# Patient Record
Sex: Female | Born: 1955 | Hispanic: No | Marital: Married | Smoking: Never smoker
Health system: Southern US, Community
[De-identification: ages and names within clinical notes are randomized; demographics above are authoritative.]

## PROBLEM LIST (undated history)

## (undated) DIAGNOSIS — M810 Age-related osteoporosis without current pathological fracture: Secondary | ICD-10-CM

## (undated) DIAGNOSIS — I83813 Varicose veins of bilateral lower extremities with pain: Secondary | ICD-10-CM

## (undated) HISTORY — PX: TONSILLECTOMY: SUR1361

## (undated) HISTORY — PX: TUBAL LIGATION: SHX77

## (undated) HISTORY — DX: Varicose veins of bilateral lower extremities with pain: I83.813

## (undated) HISTORY — PX: OTHER SURGICAL HISTORY: SHX169

---

## 2009-09-19 ENCOUNTER — Ambulatory Visit (HOSPITAL_COMMUNITY): Admission: RE | Admit: 2009-09-19 | Discharge: 2009-09-19 | Payer: Self-pay | Admitting: Obstetrics and Gynecology

## 2010-08-14 ENCOUNTER — Ambulatory Visit
Admission: RE | Admit: 2010-08-14 | Discharge: 2010-08-14 | Disposition: A | Discharge status: Home / Self Care | Payer: BC Managed Care – PPO | Source: Ambulatory Visit | Attending: Family Medicine | Admitting: Family Medicine

## 2010-08-14 ENCOUNTER — Other Ambulatory Visit: Payer: Self-pay | Admitting: Family Medicine

## 2010-08-14 DIAGNOSIS — W19XXXA Unspecified fall, initial encounter: Secondary | ICD-10-CM

## 2010-09-14 LAB — CBC
MCV: 67 fL — ABNORMAL LOW (ref 78.0–100.0)
Platelets: 295 10*3/uL (ref 150–400)
WBC: 4.9 10*3/uL (ref 4.0–10.5)

## 2010-09-14 LAB — HCG, SERUM, QUALITATIVE: Preg, Serum: NEGATIVE

## 2012-04-17 ENCOUNTER — Ambulatory Visit
Admission: RE | Admit: 2012-04-17 | Discharge: 2012-04-17 | Disposition: A | Discharge status: Home / Self Care | Payer: BC Managed Care – PPO | Source: Ambulatory Visit | Attending: Family Medicine | Admitting: Family Medicine

## 2012-04-17 ENCOUNTER — Other Ambulatory Visit: Payer: Self-pay | Admitting: Family Medicine

## 2012-04-17 DIAGNOSIS — M5410 Radiculopathy, site unspecified: Secondary | ICD-10-CM

## 2012-04-17 DIAGNOSIS — M545 Low back pain: Secondary | ICD-10-CM

## 2013-06-11 ENCOUNTER — Ambulatory Visit (INDEPENDENT_AMBULATORY_CARE_PROVIDER_SITE_OTHER): Payer: BC Managed Care – PPO | Admitting: Family Medicine

## 2013-06-11 ENCOUNTER — Encounter: Payer: Self-pay | Admitting: Family Medicine

## 2013-06-11 VITALS — BP 130/72 | HR 72 | Temp 97.7°F | Resp 14 | Ht 64.0 in | Wt 139.0 lb

## 2013-06-11 DIAGNOSIS — J029 Acute pharyngitis, unspecified: Secondary | ICD-10-CM

## 2013-06-11 DIAGNOSIS — J111 Influenza due to unidentified influenza virus with other respiratory manifestations: Secondary | ICD-10-CM

## 2013-06-11 MED ORDER — OSELTAMIVIR PHOSPHATE 75 MG PO CAPS: 75.0000 mg | ORAL_CAPSULE | Freq: Two times a day (BID) | ORAL | Status: DC

## 2013-06-11 NOTE — Progress Notes (Signed)
   Subjective:    Patient ID: Rebecca Herman, female    DOB: [birth year], 57 y.o.   MRN: [id number]  HPI  Patient reports 2-3 days of bilateral sinus pressure, sore throat, postnasal drip, cough productive of clear and green mucus, no cervical lymphadenopathy, and diffuse myalgias. She is been exposed to 20 different cases of flu at her school. She denies any exposure to strep throat. Furthermore she declines a strep screen today. Marland KitchenNo past medical history on file. No current outpatient prescriptions on file prior to visit.   No current facility-administered medications on file prior to visit.   No Known Allergies History   Social History  . Marital Status: Married    Spouse Name: N/A    Number of Children: N/A  . Years of Education: N/A   Occupational History  . Not on file.   Social History Main Topics  . Smoking status: Never Smoker   . Smokeless tobacco: Not on file  . Alcohol Use: No  . Drug Use: No  . Sexual Activity: Not on file   Other Topics Concern  . Not on file   Social History Narrative  . No narrative on file     Review of Systems  All other systems reviewed and are negative.       Objective:   Physical Exam  Vitals reviewed. Constitutional: She appears well-developed and well-nourished. No distress.  HENT:  Right Ear: External ear normal.  Left Ear: External ear normal.  Nose: Nose normal.  Mouth/Throat: Oropharynx is clear and moist. No oropharyngeal exudate.  Eyes: Conjunctivae are normal. Pupils are equal, round, and reactive to light. Right eye exhibits no discharge. Left eye exhibits no discharge. No scleral icterus.  Neck: Neck supple.  Cardiovascular: Normal rate, regular rhythm and normal heart sounds.   No murmur heard. Pulmonary/Chest: Effort normal and breath sounds normal. No respiratory distress. She has no wheezes. She has no rales.  Abdominal: Soft. Bowel sounds are normal. She exhibits no distension. There is no tenderness. There is  no rebound.  Lymphadenopathy:    She has cervical adenopathy.  Skin: She is not diaphoretic.          Assessment & Plan:  1. Sore throat  2. Influenza with other respiratory manifestations Patient's exam today does not appear to be strep throat. Furthermore her symptoms a viral upper respiratory infection. Given her numerous exposures to influenza are present influenza. The patient declines an influenza screen today. Begin Tamiflu 75 mg by mouth twice a day for 5 days. - oseltamivir (TAMIFLU) 75 MG capsule; Take 1 capsule (75 mg total) by mouth 2 (two) times daily.  Dispense: 10 capsule; Refill: 0

## 2013-06-18 ENCOUNTER — Telehealth: Payer: Self-pay | Admitting: Family Medicine

## 2013-06-18 ENCOUNTER — Other Ambulatory Visit: Payer: Self-pay | Admitting: Family Medicine

## 2013-06-18 MED ORDER — AMOXICILLIN 875 MG PO TABS: 875.0000 mg | ORAL_TABLET | Freq: Two times a day (BID) | ORAL | Status: DC

## 2013-06-18 NOTE — Telephone Encounter (Signed)
I escribed amoxicillin to [facility] in [place]

## 2013-06-18 NOTE — Telephone Encounter (Signed)
..  Patient aware per vm 

## 2013-06-18 NOTE — Telephone Encounter (Signed)
Pt has an apt tomorrow but she is wanting to know if Dr Tanya Nones would just write her a prescription she thinks it is sinus Call back number is [phone]

## 2013-06-19 ENCOUNTER — Ambulatory Visit (INDEPENDENT_AMBULATORY_CARE_PROVIDER_SITE_OTHER): Payer: BC Managed Care – PPO | Admitting: Family Medicine

## 2013-06-19 ENCOUNTER — Encounter: Payer: Self-pay | Admitting: Family Medicine

## 2013-06-19 VITALS — BP 110/68 | HR 74 | Temp 97.1°F | Resp 14 | Ht 63.5 in | Wt 143.0 lb

## 2013-06-19 DIAGNOSIS — J019 Acute sinusitis, unspecified: Secondary | ICD-10-CM

## 2013-06-19 NOTE — Progress Notes (Signed)
Subjective:    Patient ID: Rebecca Herman, female    DOB: 06/03/1956, 57 y.o.   MRN: [id number]  HPI  06/11/13  Patient reports 2-3 days of bilateral sinus pressure, sore throat, postnasal drip, cough productive of clear and green mucus, no cervical lymphadenopathy, and diffuse myalgias. She is been exposed to 20 different cases of flu at her school. She denies any exposure to strep throat. Furthermore she declines a strep screen today.  At that time, my plan was: 1. Sore throat 2. Influenza with other respiratory manifestations Patient's exam today does not appear to be strep throat. Furthermore her symptoms are consistent with a viral upper respiratory infection. Given her numerous exposures to influenza, I believe her present infection is influenza. The patient declines an influenza screen today. Begin Tamiflu 75 mg by mouth twice a day for 5 days. - oseltamivir (TAMIFLU) 75 MG capsule; Take 1 capsule (75 mg total) by mouth 2 (two) times daily.  Dispense: 10 capsule; Refill: 0  06/19/13 Patient has now had her symptoms for 10 days. She continues to have a low-grade fever. Furthermore she developed pain in her maxillary and frontal sinuses. She has a pounding throbbing headache primarily in her occiput. She has brown purulent nasal discharge epistaxis.  This has began last 2 days. She denies any shortness of breath. Cough is gradually improving. Marland KitchenNo past medical history on file. Current Outpatient Prescriptions on File Prior to Visit  Medication Sig Dispense Refill  . amoxicillin (AMOXIL) 875 MG tablet Take 1 tablet (875 mg total) by mouth 2 (two) times daily.  20 tablet  0   No current facility-administered medications on file prior to visit.   No Known Allergies History   Social History  . Marital Status: Married    Spouse Name: N/A    Number of Children: N/A  . Years of Education: N/A   Occupational History  . Not on file.   Social History Main Topics  . Smoking status: Never  Smoker   . Smokeless tobacco: Not on file  . Alcohol Use: No  . Drug Use: No  . Sexual Activity: Not on file   Other Topics Concern  . Not on file   Social History Narrative  . No narrative on file     Review of Systems  All other systems reviewed and are negative.       Objective:   Physical Exam  Vitals reviewed. Constitutional: She appears well-developed and well-nourished. No distress.  HENT:  Right Ear: External ear normal.  Left Ear: External ear normal.  Nose: Mucosal edema and rhinorrhea present. Right sinus exhibits maxillary sinus tenderness. Left sinus exhibits maxillary sinus tenderness.  Mouth/Throat: Oropharynx is clear and moist. No oropharyngeal exudate.  Eyes: Conjunctivae are normal. Pupils are equal, round, and reactive to light. Right eye exhibits no discharge. Left eye exhibits no discharge. No scleral icterus.  Neck: Neck supple.  Cardiovascular: Normal rate, regular rhythm and normal heart sounds.   No murmur heard. Pulmonary/Chest: Effort normal and breath sounds normal. No respiratory distress. She has no wheezes. She has no rales.  Abdominal: Soft. Bowel sounds are normal. She exhibits no distension. There is no tenderness. There is no rebound.  Lymphadenopathy:    She has cervical adenopathy.  Skin: She is not diaphoretic.          Assessment & Plan:   1. Acute rhinosinusitis I explained to the patient the typical course for viral influenza is  7-10 days. She is  approaching the end of this convalescent period.  However I do believe she may have developed a secondary bacterial sinusitis. I will begin the patient on amoxicillin 875 mg by mouth twice a day for 10 days. I continue to recommend supportive care including Sudafed for congestion, Mucinex for cough and congestion, and ibuprofen for aches and pains and fever.

## 2013-08-20 ENCOUNTER — Telehealth: Payer: Self-pay | Admitting: Family Medicine

## 2013-08-20 NOTE — Telephone Encounter (Signed)
Ok to work in.

## 2013-08-20 NOTE — Telephone Encounter (Signed)
Called pt and she is stating that she is hurting really bad and wants to come in and see you tomorrow if at all possible. IS it ok with you to put her in sameday appt at 10am tomorrow?

## 2013-08-20 NOTE — Telephone Encounter (Signed)
Pt aware.

## 2013-08-20 NOTE — Telephone Encounter (Signed)
Call back number is during the day till 4 [phone] and after 4 call [phone] The veins left leg on the calve are swollen and they hurt and it has a knot she is wanting a referral to the vascular doctor

## 2013-08-21 ENCOUNTER — Ambulatory Visit (INDEPENDENT_AMBULATORY_CARE_PROVIDER_SITE_OTHER): Payer: BC Managed Care – PPO | Admitting: Family Medicine

## 2013-08-21 ENCOUNTER — Encounter: Payer: Self-pay | Admitting: Family Medicine

## 2013-08-21 VITALS — BP 134/86 | HR 64 | Temp 97.8°F | Resp 18 | Ht 61.75 in | Wt 145.0 lb

## 2013-08-21 DIAGNOSIS — I809 Phlebitis and thrombophlebitis of unspecified site: Secondary | ICD-10-CM

## 2013-08-21 NOTE — Progress Notes (Signed)
   Subjective:    Patient ID: Rebecca Herman, female    DOB: June 13, 1956, 58 y.o.   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 Yesterday, a varicosity on her posterior left calf became red hot and tender. Today it is much better. There is no swelling in the left leg compared to the right leg. The circumference of her left leg is 39 cm. The circumference of her right leg is 39 cm. Both measurements are taken 10 cm below the inferior pole of the patella. There is no redness. There is a tender varicosity in her posterior left calf but otherwise the examination of her left leg is normal. She has no pitting edema in the legs. No past medical history on file. No current outpatient prescriptions on file prior to visit.   No current facility-administered medications on file prior to visit.   No Known Allergies History   Social History  . Marital Status: Married    Spouse Name: N/A    Number of Children: N/A  . Years of Education: N/A   Occupational History  . Not on file.   Social History Main Topics  . Smoking status: Never Smoker   . Smokeless tobacco: Not on file  . Alcohol Use: No  . Drug Use: No  . Sexual Activity: Not on file   Other Topics Concern  . Not on file   Social History Narrative  . No narrative on file      Review of Systems  All other systems reviewed and are negative.       Objective:   Physical Exam  Vitals reviewed. Cardiovascular: Normal rate, regular rhythm and normal heart sounds.   Pulmonary/Chest: Effort normal and breath sounds normal. No respiratory distress. She has no wheezes. She has no rales.  Musculoskeletal: She exhibits no edema.  Skin: No rash noted. No erythema.  tender varicose vein on the posterior left calf        Assessment & Plan:  1. Superficial thrombophlebitis Begin warm compresses 4 times a day, began taking aspirin as needed for pain and  swelling. But the leg is much as possible. Recheck if symptoms worsen. Anticipate spontaneous resolution over the next week. I do not feel the patient has a DVT.

## 2014-01-25 ENCOUNTER — Other Ambulatory Visit: Payer: Self-pay | Admitting: Obstetrics & Gynecology

## 2014-01-28 LAB — CYTOLOGY - PAP

## 2014-09-11 ENCOUNTER — Encounter: Payer: Self-pay | Admitting: Family Medicine

## 2014-09-11 ENCOUNTER — Encounter: Payer: Self-pay | Admitting: Physician Assistant

## 2014-09-11 ENCOUNTER — Ambulatory Visit (INDEPENDENT_AMBULATORY_CARE_PROVIDER_SITE_OTHER): Payer: BC Managed Care – PPO | Admitting: Physician Assistant

## 2014-09-11 VITALS — BP 144/86 | HR 64 | Temp 98.1°F | Resp 18 | Wt 158.0 lb

## 2014-09-11 DIAGNOSIS — J0120 Acute ethmoidal sinusitis, unspecified: Secondary | ICD-10-CM | POA: Diagnosis not present

## 2014-09-11 DIAGNOSIS — J988 Other specified respiratory disorders: Secondary | ICD-10-CM

## 2014-09-11 DIAGNOSIS — B9689 Other specified bacterial agents as the cause of diseases classified elsewhere: Secondary | ICD-10-CM

## 2014-09-11 MED ORDER — PREDNISONE 20 MG PO TABS: ORAL_TABLET | ORAL | Status: DC

## 2014-09-11 MED ORDER — FLUCONAZOLE 150 MG PO TABS: 150.0000 mg | ORAL_TABLET | Freq: Once | ORAL | Status: DC

## 2014-09-11 MED ORDER — AZITHROMYCIN 250 MG PO TABS: ORAL_TABLET | ORAL | Status: DC

## 2014-09-11 NOTE — Progress Notes (Signed)
Patient ID: Juanice Warburton MRN: [id number], DOB: 12-22-1955, 59 y.o. Date of Encounter: @DATE @  Chief Complaint:  Chief Complaint  Patient presents with  . sick x 2 days    sinus infection    HPI: 59 y.o. year old female  presents with above symptoms. Says she's actually been sick for over a week but the symptoms of just gotten a lot worse over the past 2 days. Is that she has been blowing thick yellow-green mucus from her nose and also coughing up phlegm that is the same thick yellow-green. Says the past couple of days it is really gotten into her sinuses to the point it is making her top teeth hurt and her years have a ringing sound in them. Also has spells of coughing "fits ". Says that she is not getting good sleep because she ends up having to get up and walking around to get things settled down and stop coughing and settle the congestion. Works at [employer] and is constantly around children with germs.   History reviewed. No pertinent past medical history.   Home Meds: No outpatient prescriptions prior to visit.   No facility-administered medications prior to visit.    Allergies: No Known Allergies  History   Social History  . Marital Status: Married    Spouse Name: N/A  . Number of Children: N/A  . Years of Education: N/A   Occupational History  . Not on file.   Social History Main Topics  . Smoking status: Never Smoker   . Smokeless tobacco: Not on file  . Alcohol Use: No  . Drug Use: No  . Sexual Activity: Not on file   Other Topics Concern  . Not on file   Social History Narrative    History reviewed. No pertinent family history.   Review of Systems:  See HPI for pertinent ROS. All other ROS negative.    Physical Exam: Blood pressure 144/86, pulse 64, temperature 98.1 F (36.7 C), temperature source Oral, resp. rate 18, weight 158 lb (71.668 kg)., Body mass index is 29.15 kg/(m^2). General: Female. Sounds congested when she talks. Appears in no  acute distress. Head: Normocephalic, atraumatic, eyes without discharge, sclera non-icteric, nares are without discharge. Bilateral auditory canals clear, TM's are without perforation, pearly grey and translucent with reflective cone of light bilaterally. Oral cavity moist, posterior pharynx without exudate, erythema, peritonsillar abscess. Mild tenderness with percussion of bilateral maxillary sinuses. Minimal tenderness with percussion of frontal sinuses.  Neck: Supple. No thyromegaly. No lymphadenopathy. Lungs: Clear bilaterally to auscultation without wheezes, rales, or rhonchi. Breathing is unlabored. Heart: RRR with S1 S2. No murmurs, rubs, or gallops. Musculoskeletal:  Strength and tone normal for age. Extremities/Skin: Warm and dry.  Neuro: Alert and oriented X 3. Moves all extremities spontaneously. Gait is normal. CNII-XII grossly in tact. Psych:  Responds to questions appropriately with a normal affect.     ASSESSMENT AND PLAN:  59 y.o. year old female with  1. Acute ethmoidal sinusitis, recurrence not specified - azithromycin (ZITHROMAX) 250 MG tablet; Day 1: Take 2 daily. Days 2-5: Take 1 daily.  Dispense: 6 tablet; Refill: 0 - predniSONE (DELTASONE) 20 MG tablet; Take 3 daily for 2 days, then 2 daily for 2 days, then 1 daily for 2 days.  Dispense: 12 tablet; Refill: 0  2. Bacterial respiratory infection - azithromycin (ZITHROMAX) 250 MG tablet; Day 1: Take 2 daily. Days 2-5: Take 1 daily.  Dispense: 6 tablet; Refill: 0 - predniSONE (DELTASONE)  20 MG tablet; Take 3 daily for 2 days, then 2 daily for 2 days, then 1 daily for 2 days.  Dispense: 12 tablet; Refill: 0   Take prednisone and antibiotic as directed and complete both of them as directed. Can use over-the-counter decongestants and cough medications as needed for symptom relief as well. Follow-up if symptoms do not resolve with completion of antibiotic and prednisone. She also states that antibiotic frequently gives her  yeast infection so I did send in one Diflucan after I had printed the above.  [address] [place], [place], [facility] 09/11/2014 10:25 AM

## 2015-01-30 ENCOUNTER — Other Ambulatory Visit: Payer: Self-pay | Admitting: Obstetrics and Gynecology

## 2015-01-31 LAB — CYTOLOGY - PAP

## 2015-02-05 ENCOUNTER — Encounter: Payer: Self-pay | Admitting: Physician Assistant

## 2015-02-05 ENCOUNTER — Ambulatory Visit (INDEPENDENT_AMBULATORY_CARE_PROVIDER_SITE_OTHER): Payer: BC Managed Care – PPO | Admitting: Physician Assistant

## 2015-02-05 VITALS — BP 126/76 | HR 72 | Temp 98.1°F | Resp 16 | Ht 62.0 in | Wt 155.0 lb

## 2015-02-05 DIAGNOSIS — B029 Zoster without complications: Secondary | ICD-10-CM

## 2015-02-05 MED ORDER — VALACYCLOVIR HCL 1 G PO TABS: 1000.0000 mg | ORAL_TABLET | Freq: Three times a day (TID) | ORAL | Status: DC

## 2015-02-05 MED ORDER — GABAPENTIN 300 MG PO CAPS: ORAL_CAPSULE | ORAL | Status: DC

## 2015-02-05 NOTE — Progress Notes (Addendum)
Patient ID: Rebecca Herman MRN: [id number], DOB: 09/14/1955, 59 y.o. Date of Encounter: 02/05/2015, 11:44 AM    Chief Complaint:  Chief Complaint  Patient presents with  . Acute Visit    possible shingles on back.     HPI: 59 y.o. year old female states that 1-1/2 weeks ago she developed a rash--which she shows me today--currently is an approximate 1 inch diameter circle area that is area of resolving vesicles clustered/bunched within this 1 inch diameter circle. She states that there has been no other area of rash. She states that at the same time she noticed this rash, she also noticed sciatic-type pain going down the lateral aspect of her right leg. Has had no other area of rash. Has had no other area of pain. No other complaints or concerns today.     Home Meds:   Outpatient Prescriptions Prior to Visit  Medication Sig Dispense Refill  . Calcium Carbonate-Vitamin D (CALCIUM + D PO) Take 1 tablet by mouth 2 (two) times daily.    . Multiple Vitamin (MULTIVITAMIN) tablet Take 1 tablet by mouth daily.    Marland Kitchen azithromycin (ZITHROMAX) 250 MG tablet Day 1: Take 2 daily. Days 2-5: Take 1 daily. (Patient not taking: Reported on 02/05/2015) 6 tablet 0  . fluconazole (DIFLUCAN) 150 MG tablet Take 1 tablet (150 mg total) by mouth once. (Patient not taking: Reported on 02/05/2015) 1 tablet 0  . predniSONE (DELTASONE) 20 MG tablet Take 3 daily for 2 days, then 2 daily for 2 days, then 1 daily for 2 days. (Patient not taking: Reported on 02/05/2015) 12 tablet 0   No facility-administered medications prior to visit.    Allergies: No Known Allergies    Review of Systems: See HPI for pertinent ROS. All other ROS negative.    Physical Exam: Blood pressure 126/76, pulse 72, temperature 98.1 F (36.7 C), temperature source Oral, resp. rate 16, height 5\' 2"  (1.575 m), weight 155 lb (70.308 kg)., Body mass index is 28.34 kg/(m^2). General:  WNWD Female. Appears in no acute distress. Neck: Supple. No  thyromegaly. No lymphadenopathy. Lungs: Clear bilaterally to auscultation without wheezes, rales, or rhonchi. Breathing is unlabored. Heart: Regular rhythm. No murmurs, rubs, or gallops. Msk:  Strength and tone normal for age. Extremities/Skin: Just above the top of the crease of her buttocks, on her low back, at approx L5-S1 level, midline, there is a 1-inch diameter circle that is filled with drying/scabbing vessicles.  Neuro: Alert and oriented X 3. Moves all extremities spontaneously. Gait is normal. CNII-XII grossly in tact. Psych:  Responds to questions appropriately with a normal affect.     ASSESSMENT AND PLAN:  59 y.o. year old female with  1. Shingles - valACYclovir (VALTREX) 1000 MG tablet; Take 1 tablet (1,000 mg total) by mouth 3 (three) times daily.  Dispense: 21 tablet; Refill: 0 - gabapentin (NEURONTIN) 300 MG capsule; Day 1: Take one. Day 2: Take one twice a day. Day 3: Take one 3 times a day.  Continue this dose. Call if pain not controlled with this dose.  Dispense: 90 capsule; Refill: 1  I discussed with her that with shingles some people have very minimal pain, some people have very severe pain. Also discussed that the duration of the pain varies from person to person. Told her to take the Neurontin as directed above. Told her once she gets to be on day 3 and has been taking one 3 times daily for a few days, that if  this dose is not controlling her pain-- to call us for further instructions for further dosing adjustment. Told her that after about 2 weeks of pain being controlled, she can try decreasing the Neurontin and see if she can wean off of it. Discussed that duration of pain and need for this medication varies.  She is currently having mild discomfort so I feel that the Neurontin should control her pain symptoms without needing "pain pills ".  Discussed need to avoid being in close proximity to pregnant women. And avoid skin to skin contact in this  region.  Signed, [address] [place], [place], [facility] 02/05/2015 11:44 AM

## 2015-02-13 ENCOUNTER — Other Ambulatory Visit: Payer: Self-pay | Admitting: Obstetrics and Gynecology

## 2015-02-13 DIAGNOSIS — Z803 Family history of malignant neoplasm of breast: Secondary | ICD-10-CM

## 2015-09-15 ENCOUNTER — Encounter: Payer: Self-pay | Admitting: Family Medicine

## 2015-09-15 ENCOUNTER — Ambulatory Visit (INDEPENDENT_AMBULATORY_CARE_PROVIDER_SITE_OTHER): Payer: BC Managed Care – PPO | Admitting: Family Medicine

## 2015-09-15 VITALS — BP 130/72 | HR 74 | Temp 97.9°F | Resp 18 | Ht 62.0 in | Wt 164.0 lb

## 2015-09-15 DIAGNOSIS — K219 Gastro-esophageal reflux disease without esophagitis: Secondary | ICD-10-CM

## 2015-09-15 MED ORDER — DEXLANSOPRAZOLE 60 MG PO CPDR: 60.0000 mg | DELAYED_RELEASE_CAPSULE | Freq: Every day | ORAL | Status: DC

## 2015-09-15 NOTE — Patient Instructions (Signed)
Try the dexilant once a day  Work on dietary changes Call if not improved in 2 weeks and referral to Dr. Earlean Shawl F/U as needed

## 2015-09-15 NOTE — Progress Notes (Signed)
Patient ID: Rebecca Herman, female   DOB: 01-18-1956, 60 y.o.   MRN: [id number]   Subjective:    Patient ID: Rebecca Herman, female    DOB: 02/24/1956, 60 y.o.   MRN: [id number]  Patient presents for Swallowing Difficulty Patient here after an episode of difficulty swallowing. She states that she was eating a cinnamon bun late at night and she felt like he got lost in her throat she had some coughing afterwards. She however has noticed some mild reflux symptoms. She's had reflux in the past after she changed her diet she went off her medications. She even had an EGD done in the past but she does not recall any strictures. She notices a sour brash-like taste in the back her mouth and also change to her breath over the past few months. She does admit that she has been eating more fried foods and heavy meal she is admitted about 10 pounds since last summer. She does not feel any acid sensation coming up the chest denies any change in her bowels denies any nausea vomiting. She's not had any other symptoms of dysphagia    Review Of Systems:  GEN- denies fatigue, fever, weight loss,weakness, recent illness HEENT- denies eye drainage, change in vision, nasal discharge, CVS- denies chest pain, palpitations RESP- denies SOB, cough, wheeze ABD- denies N/V, change in stools, abd pain GU- denies dysuria, hematuria, dribbling, incontinence MSK- denies joint pain, muscle aches, injury Neuro- denies headache, dizziness, syncope, seizure activity       Objective:    BP 130/72 mmHg  Pulse 74  Temp(Src) 97.9 F (36.6 C) (Oral)  Resp 18  Ht 5\' 2"  (1.575 m)  Wt 164 lb (74.39 kg)  BMI 29.99 kg/m2 GEN- NAD, alert and oriented x3 HEENT- PERRL, EOMI, non injected sclera, pink conjunctiva, MMM, oropharynx clear Neck- Supple, no LAD  CVS- RRR, no murmur RESP-CTAB ABD-NABS,soft,NT,ND         Assessment & Plan:      Problem List Items Addressed This Visit    None    Visit Diagnoses    Gastroesophageal reflux disease without esophagitis    -  Primary    Recent globus sensation with food getting stuck, able to take vitamins eat otherwise without any other symptoms, could be recurrnce of GERD, with other symptoms, trial of dexilant, [employer] given. Also discussed dietary changes. She has recurrence of the sensation of food getting stuck and recommended EGD be done     Relevant Medications    dexlansoprazole (DEXILANT) 60 MG capsule       Note: This dictation was prepared with Dragon dictation along with smaller phrase technology. Any transcriptional errors that result from this process are unintentional.

## 2015-10-08 ENCOUNTER — Ambulatory Visit: Payer: BC Managed Care – PPO | Admitting: Podiatry

## 2015-10-09 ENCOUNTER — Ambulatory Visit (INDEPENDENT_AMBULATORY_CARE_PROVIDER_SITE_OTHER): Payer: BC Managed Care – PPO | Admitting: Podiatry

## 2015-10-09 ENCOUNTER — Ambulatory Visit: Payer: Self-pay

## 2015-10-09 ENCOUNTER — Ambulatory Visit (INDEPENDENT_AMBULATORY_CARE_PROVIDER_SITE_OTHER): Payer: BC Managed Care – PPO

## 2015-10-09 ENCOUNTER — Encounter: Payer: Self-pay | Admitting: Podiatry

## 2015-10-09 VITALS — BP 130/72 | HR 67 | Resp 16 | Ht 63.5 in | Wt 160.0 lb

## 2015-10-09 DIAGNOSIS — M79671 Pain in right foot: Secondary | ICD-10-CM | POA: Diagnosis not present

## 2015-10-09 DIAGNOSIS — M722 Plantar fascial fibromatosis: Secondary | ICD-10-CM

## 2015-10-09 DIAGNOSIS — M79673 Pain in unspecified foot: Secondary | ICD-10-CM

## 2015-10-09 DIAGNOSIS — M79672 Pain in left foot: Secondary | ICD-10-CM

## 2015-10-09 MED ORDER — TRIAMCINOLONE ACETONIDE 10 MG/ML IJ SUSP
10.0000 mg | Freq: Once | INTRAMUSCULAR | Status: AC
  Administered 2015-10-09: 10 mg

## 2015-10-09 NOTE — Progress Notes (Signed)
   Subjective:    Patient ID: Rebecca Herman, female    DOB: 12-11-1955, 60 y.o.   MRN: [id number]  HPI Chief Complaint  Patient presents with  . Foot Pain    Bilateral; heel; pt stated, "Has more pain in Left foot; went to [place] last week; did not get an injection and does not prefer to get one"      Review of Systems  All other systems reviewed and are negative.      Objective:   Physical Exam        Assessment & Plan:

## 2015-10-09 NOTE — Progress Notes (Signed)
Subjective:     Patient ID: Rebecca Herman, female   DOB: 09-28-1955, 60 y.o.   MRN: [id number]  HPI patient presents stating I have a lot of pain in the bottom of my left heel and I went to another doctor who gave me a brace but it's not been helpful and I cannot do any the exercises that I enjoyed doing   Review of Systems  All other systems reviewed and are negative.      Objective:   Physical Exam  Constitutional: She is oriented to person, place, and time.  Cardiovascular: Intact distal pulses.   Musculoskeletal: Normal range of motion.  Neurological: She is oriented to person, place, and time.  Skin: Skin is warm.  Nursing note and vitals reviewed.  neurovascular status intact muscle strength adequate range of motion within normal limits with patient found to have quite a bit of discomfort in the plantar aspect left heel at the insertional point tendon the calcaneus with inflammation and fluid around the medial band. Patient's noted to have moderate depression of the arch also noted and has good digital perfusion and is well oriented 3     Assessment:     Inflammatory fasciitis left heel at the insertion of tendon into the calcaneus    Plan:     H&P and x-ray reviewed with patient. I injected the left plantar fashion 3 mg Kenalog 5 mg Xylocaine and applied fascial brace with instructions on usage. Placed into supportive shoes and instructed on physical therapy and reappoint in 2 weeks  X-ray report was negative for signs of fracture and indicated minimal spur formation

## 2015-10-09 NOTE — Patient Instructions (Signed)

## 2015-10-23 ENCOUNTER — Ambulatory Visit: Payer: BC Managed Care – PPO | Admitting: Podiatry

## 2015-10-27 ENCOUNTER — Ambulatory Visit (INDEPENDENT_AMBULATORY_CARE_PROVIDER_SITE_OTHER): Payer: BC Managed Care – PPO | Admitting: Podiatry

## 2015-10-27 ENCOUNTER — Encounter: Payer: Self-pay | Admitting: Podiatry

## 2015-10-27 DIAGNOSIS — M722 Plantar fascial fibromatosis: Secondary | ICD-10-CM

## 2015-10-27 MED ORDER — TRIAMCINOLONE ACETONIDE 10 MG/ML IJ SUSP
10.0000 mg | Freq: Once | INTRAMUSCULAR | Status: AC
  Administered 2015-10-27: 10 mg

## 2015-10-29 NOTE — Progress Notes (Signed)
Subjective:     Patient ID: Rebecca Herman, female   DOB: 06/24/1955, 60 y.o.   MRN: [id number]  HPI patient states I'm doing pretty well but I'm still having a spot of pain in my heel   Review of Systems     Objective:   Physical Exam Neurovascular status intact muscle strength adequate with patient still having discomfort plantar heel left at the insertional point tendon into the calcaneus    Assessment:     Plantar fasciitis improving but still present    Plan:     Reinjected the plantar fascial left 3 [place] Kenalog 5 mg Xylocaine advised on physical therapy and reappoint to recheck

## 2016-08-27 ENCOUNTER — Encounter: Payer: Self-pay | Admitting: Family Medicine

## 2016-08-27 ENCOUNTER — Ambulatory Visit (INDEPENDENT_AMBULATORY_CARE_PROVIDER_SITE_OTHER): Payer: BC Managed Care – PPO | Admitting: Family Medicine

## 2016-08-27 VITALS — BP 126/68 | HR 74 | Temp 98.0°F | Resp 16 | Ht 63.5 in | Wt 144.0 lb

## 2016-08-27 DIAGNOSIS — I872 Venous insufficiency (chronic) (peripheral): Secondary | ICD-10-CM | POA: Diagnosis not present

## 2016-08-27 NOTE — Progress Notes (Signed)
   Subjective:    Patient ID: Rebecca Herman, female    DOB: 01-22-1956, 61 y.o.   MRN: [id number]  HPI  The patient reports swelling on the left side of her body. She states that she feels that swollen on the left side of her face in her gluteus and in her left leg and left arm. However on close inspection today I can see no obvious asymmetry. In fact her calf circumference in her right leg is 41.5 cm 3 inches below the patella. It is 40 cm in the left leg 3 inches below the patella and is actually less. There is no pitting edema. There is no edema in her arms. There is no visible edema in her face or in her lips. Her heart shows normal sinus rhythm with no murmurs or rubs. Her lungs are clear to auscultation and there is no JVD. She does have very large varicose veins particularly on the left leg in the hamstring area. Some are approximately 2-3 cm in diameter. This can cause possible episodic swelling in her legs No past medical history on file. No past surgical history on file. Current Outpatient Prescriptions on File Prior to Visit  Medication Sig Dispense Refill  . Calcium Carbonate-Vitamin D (CALCIUM + D PO) Take 1 tablet by mouth 2 (two) times daily.    Marland Kitchen CINNAMON PO Take 1,000 mg by mouth daily.    . Multiple Vitamin (MULTIVITAMIN) tablet Take 1 tablet by mouth daily.     No current facility-administered medications on file prior to visit.    No Known Allergies Social History   Social History  . Marital status: Married    Spouse name: N/A  . Number of children: N/A  . Years of education: N/A   Occupational History  . Not on file.   Social History Main Topics  . Smoking status: Never Smoker  . Smokeless tobacco: Never Used  . Alcohol use No  . Drug use: No  . Sexual activity: Not on file   Other Topics Concern  . Not on file   Social History Narrative  . No narrative on file     Review of Systems  All other systems reviewed and are negative.      Objective:   Physical Exam  Neck: No JVD present.  Cardiovascular: Normal rate, regular rhythm and normal heart sounds.   No murmur heard. Pulmonary/Chest: Effort normal and breath sounds normal. No respiratory distress. She has no wheezes. She has no rales.  Abdominal: Soft. Bowel sounds are normal. She exhibits no distension. There is no tenderness. There is no rebound and no guarding.  Musculoskeletal: She exhibits no edema.  Vitals reviewed.         Assessment & Plan:  Chronic venous insufficiency  I can appreciate no asymmetry in the body. There is no obvious swelling. There is severe varicose veins secondary to chronic venous insufficiency. I recommended that the patient wear thigh-high compression stockings. I reassured her that there is no risk of blood clot that I can appreciate on today's exam. I will be glad to refer her to a vascular surgeon to discuss surgical options for treatment, but the patient is undecided at the present time

## 2016-08-30 ENCOUNTER — Telehealth: Payer: Self-pay | Admitting: Family Medicine

## 2016-08-30 DIAGNOSIS — I872 Venous insufficiency (chronic) (peripheral): Secondary | ICD-10-CM

## 2016-08-30 NOTE — Telephone Encounter (Signed)
Patient would like a referral for her Varicose  vein she states she has discussed with Dr. Dennard Schaumann at her last appt.  CB# [phone]

## 2016-09-02 NOTE — Telephone Encounter (Signed)
ok 

## 2016-09-02 NOTE — Telephone Encounter (Signed)
Referral placed to Vein and Vascular

## 2016-09-23 ENCOUNTER — Encounter: Payer: Self-pay | Admitting: Vascular Surgery

## 2016-10-04 ENCOUNTER — Encounter: Payer: Self-pay | Admitting: Vascular Surgery

## 2016-10-04 ENCOUNTER — Ambulatory Visit (INDEPENDENT_AMBULATORY_CARE_PROVIDER_SITE_OTHER): Payer: BC Managed Care – PPO | Admitting: Vascular Surgery

## 2016-10-04 VITALS — BP 128/87 | HR 61 | Temp 98.5°F | Resp 16 | Ht 63.0 in | Wt 144.0 lb

## 2016-10-04 DIAGNOSIS — I83891 Varicose veins of right lower extremities with other complications: Secondary | ICD-10-CM | POA: Diagnosis not present

## 2016-10-04 DIAGNOSIS — I83893 Varicose veins of bilateral lower extremities with other complications: Secondary | ICD-10-CM | POA: Insufficient documentation

## 2016-10-04 NOTE — Progress Notes (Signed)
Subjective:     Patient ID: Rebecca Herman, female   DOB: 1955/06/26, 61 y.o.   MRN: [id number]  HPI This 61 year old female was referred by [facility] for evaluation of painful varicose veins.The patient previously had laser ablation of the right great saphenous vein performed in [facility] in 2006. She did not have stab phlebectomy. She did have residual bulging varicosities but they were improved from prior to the procedure. She now experiences left leg discomfort frequently at night which awakens her and is in aching quality. She denies a history of DVT thrombophlebitis stasis ulcers or bleeding. She does not elastic compression stockings. Both legs ache during her work week.  Past Medical History:  Diagnosis Date  . Varicose veins of bilateral lower extremities with pain     Social History  Substance Use Topics  . Smoking status: Never Smoker  . Smokeless tobacco: Never Used  . Alcohol use No    Family History  Problem Relation Age of Onset  . Cancer Mother     breast  . Diabetes Mother   . Hypertension Mother   . Cancer Father     No Known Allergies   Current Outpatient Prescriptions:  .  Calcium Carbonate-Vitamin D (CALCIUM + D PO), Take 1 tablet by mouth 2 (two) times daily., Disp: , Rfl:  .  CINNAMON PO, Take 1,000 mg by mouth daily., Disp: , Rfl:  .  Multiple Vitamin (MULTIVITAMIN) tablet, Take 1 tablet by mouth daily., Disp: , Rfl:  .  TURMERIC PO, Take by mouth., Disp: , Rfl:   Vitals:   10/04/16 1355 10/04/16 1358  BP: (!) 146/81 128/87  Pulse: 61 61  Resp: 16   Temp: 98.5 F (36.9 C)   SpO2: 100%   Weight: 144 lb (65.3 kg)   Height: 5\' 3"  (1.6 m)     Body mass index is 25.51 kg/m.        Review of Systems Denies chest pain, dyspnea on exertion, PND, orthopnea, hemoptysis, claudication    Objective:   Physical Exam BP 128/87 (BP Location: Left Arm, Patient Position: Sitting, Cuff Size: Normal)   Pulse 61   Temp 98.5  F (36.9 C)   Resp 16   Ht 5\' 3"  (1.6 m)   Wt 144 lb (65.3 kg)   SpO2 100%   BMI 25.51 kg/m     Gen.-alert and oriented x3 in no apparent distress HEENT normal for age Lungs no rhonchi or wheezing Cardiovascular regular rhythm no murmurs carotid pulses 3+ palpable no bruits audible Abdomen soft nontender no palpable masses Musculoskeletal free of  major deformities Skin clear -no rashes Neurologic normal Lower extremities 3+ femoral and dorsalis pedis pulses palpable bilaterally with no edema Right leg with extensive bulging varicosities beginning in the mid anterior thigh extending lateral to the knee and into the left calf also extending into the posterior calf and posterior distal thigh. No hyperpigmentation or ulceration noted. Left leg has bulging varicosities primarily posteriorly in the calf and popliteal fossa area with no hyperpigmentation or ulceration noted.  Today I performed a bedside SonoSite ultrasound exam. The left great saphenous vein appears normal in size with no reflux. Was unable to visualize the left small saphenous vein On the right leg the great saphenous vein appears to be absent consistent with previous ablation Right small saphenous vein was not visualized       Assessment:     Bilateral painful varicosities right worse than left with history  of previous laser ablation right great saphenous vein in 2006 in [facility] Current symptoms include bilateral leg pain which are affecting patient's daily living    Plan:         #1 long leg elastic compression stockings 20-30 mm gradient #2 elevate legs as much as possible #3 ibuprofen daily on a regular basis for pain #4 return in 3 months-she will have formal reflux bilateral exam performed upon return in formal recommendation will be made at that time. She may be a candidate only for bilateral stab phlebectomy but uncertain until we see the results of formal venous reflux exam

## 2016-10-05 NOTE — Addendum Note (Signed)
Addended by: Lianne Cure A on: 10/05/2016 09:10 AM   Modules accepted: Orders

## 2016-12-30 ENCOUNTER — Ambulatory Visit (INDEPENDENT_AMBULATORY_CARE_PROVIDER_SITE_OTHER): Payer: BC Managed Care – PPO | Admitting: Family Medicine

## 2016-12-30 ENCOUNTER — Encounter: Payer: Self-pay | Admitting: Family Medicine

## 2016-12-30 VITALS — BP 140/98 | HR 72 | Temp 98.7°F | Resp 14 | Ht 63.5 in | Wt 147.0 lb

## 2016-12-30 DIAGNOSIS — R238 Other skin changes: Secondary | ICD-10-CM

## 2016-12-30 DIAGNOSIS — R233 Spontaneous ecchymoses: Secondary | ICD-10-CM

## 2016-12-30 LAB — COMPLETE METABOLIC PANEL WITH GFR
ALBUMIN: 4.1 g/dL (ref 3.6–5.1)
ALK PHOS: 93 U/L (ref 33–130)
ALT: 16 U/L (ref 6–29)
AST: 18 U/L (ref 10–35)
BILIRUBIN TOTAL: 0.5 mg/dL (ref 0.2–1.2)
BUN: 22 mg/dL (ref 7–25)
CALCIUM: 9.3 mg/dL (ref 8.6–10.4)
CO2: 27 mmol/L (ref 20–31)
Chloride: 101 mmol/L (ref 98–110)
Creat: 0.9 mg/dL (ref 0.50–1.05)
GFR, EST AFRICAN AMERICAN: 82 mL/min (ref >=60)
GFR, EST NON AFRICAN AMERICAN: 71 mL/min (ref >=60)
GLUCOSE: 88 mg/dL (ref 70–99)
Potassium: 3.8 mmol/L (ref 3.5–5.3)
Sodium: 139 mmol/L (ref 135–146)
TOTAL PROTEIN: 6.6 g/dL (ref 6.1–8.1)

## 2016-12-30 LAB — CBC WITH DIFFERENTIAL/PLATELET
BASOS ABS: 0 {cells}/uL (ref 0–200)
Basophils Relative: 0 %
EOS ABS: 219 {cells}/uL (ref 15–500)
Eosinophils Relative: 3 %
HEMATOCRIT: 39.3 % (ref 35.0–45.0)
HEMOGLOBIN: 13 g/dL (ref 12.0–15.0)
LYMPHS ABS: 1387 {cells}/uL (ref 850–3900)
Lymphocytes Relative: 19 %
MCH: 29.6 pg (ref 27.0–33.0)
MCHC: 33.1 g/dL (ref 32.0–36.0)
MCV: 89.5 fL (ref 80.0–100.0)
MPV: 11.3 fL (ref 7.5–12.5)
Monocytes Absolute: 584 cells/uL (ref 200–950)
Monocytes Relative: 8 %
NEUTROS ABS: 5110 {cells}/uL (ref 1500–7800)
Neutrophils Relative %: 70 %
Platelets: 202 10*3/uL (ref 140–400)
RBC: 4.39 MIL/uL (ref 3.80–5.10)
RDW: 13.9 % (ref 11.0–15.0)
WBC: 7.3 10*3/uL (ref 3.8–10.8)

## 2016-12-30 MED ORDER — BENZONATATE 100 MG PO CAPS: 200.0000 mg | ORAL_CAPSULE | Freq: Three times a day (TID) | ORAL | 0 refills | Status: DC | PRN

## 2016-12-30 NOTE — Progress Notes (Signed)
   Subjective:    Patient ID: Rebecca Herman, female    DOB: Aug 06, 1955, 61 y.o.   MRN: [id number]  HPI  Patient states that she just feels "yucky". She feels tired. Symptoms began about 3 days ago. She's had a nonproductive cough, sore throat, head congestion. She denies any fever chest pain or shortness of breath or hemoptysis. Symptoms are consistent with a viral upper respiratory infection. However she has significant bruising on her bicep as well as her chest. She thinks it occurred while she was moving furniture at school however the bruising on her chest is in numerous locations and she does not remember hitting them. Past Medical History:  Diagnosis Date  . Varicose veins of bilateral lower extremities with pain    Past Surgical History:  Procedure Laterality Date  . TONSILLECTOMY    . TUBAL LIGATION    . varicose veins     Current Outpatient Prescriptions on File Prior to Visit  Medication Sig Dispense Refill  . Calcium Carbonate-Vitamin D (CALCIUM + D PO) Take 1 tablet by mouth 2 (two) times daily.    Marland Kitchen CINNAMON PO Take 1,000 mg by mouth daily.    . Multiple Vitamin (MULTIVITAMIN) tablet Take 1 tablet by mouth daily.    . TURMERIC PO Take by mouth.     No current facility-administered medications on file prior to visit.    No Known Allergies Social History   Social History  . Marital status: Married    Spouse name: N/A  . Number of children: N/A  . Years of education: N/A   Occupational History  . Not on file.   Social History Main Topics  . Smoking status: Never Smoker  . Smokeless tobacco: Never Used  . Alcohol use No  . Drug use: No  . Sexual activity: Not on file   Other Topics Concern  . Not on file   Social History Narrative  . No narrative on file     Review of Systems  All other systems reviewed and are negative.      Objective:   Physical Exam  HENT:  Right Ear: External ear normal.  Left Ear: External ear normal.  Nose: Nose normal.    Mouth/Throat: Oropharynx is clear and moist. No oropharyngeal exudate.  Neck: Neck supple.  Cardiovascular: Normal rate, regular rhythm and normal heart sounds.   No murmur heard. Pulmonary/Chest: Effort normal and breath sounds normal. No respiratory distress. She has no wheezes. She has no rales.  Abdominal: Soft. Bowel sounds are normal. She exhibits no distension. There is no tenderness. There is no rebound.  Lymphadenopathy:    She has no cervical adenopathy.  Vitals reviewed.         Assessment & Plan:  Easy bruising - Plan: CBC with Differential/Platelet, COMPLETE METABOLIC PANEL WITH GFR  Most of her symptoms sound consistent with a viral upper respiratory infection. I recommended treating this with Sudafed for congestion and Tessalon Perles 200 mg every 8 hours as needed for coughing. I'm more concerned about the bruising. There may be an explanation in that she just moved a lot of furniture however I would like to check a CBC to evaluate for any evidence of bone marrow abnormalities by checking her white blood cell count and her platelet count along with checking a CMP to evaluate her liver function test.

## 2017-01-04 ENCOUNTER — Ambulatory Visit: Payer: BC Managed Care – PPO | Admitting: Vascular Surgery

## 2017-01-04 ENCOUNTER — Encounter (HOSPITAL_COMMUNITY): Payer: BC Managed Care – PPO

## 2017-01-17 ENCOUNTER — Encounter: Payer: Self-pay | Admitting: Vascular Surgery

## 2017-01-18 ENCOUNTER — Ambulatory Visit (HOSPITAL_COMMUNITY)
Admission: RE | Admit: 2017-01-18 | Discharge: 2017-01-18 | Disposition: A | Discharge status: Home / Self Care | Payer: BC Managed Care – PPO | Source: Ambulatory Visit | Attending: Vascular Surgery | Admitting: Vascular Surgery

## 2017-01-18 ENCOUNTER — Encounter: Payer: Self-pay | Admitting: Vascular Surgery

## 2017-01-18 ENCOUNTER — Ambulatory Visit (INDEPENDENT_AMBULATORY_CARE_PROVIDER_SITE_OTHER): Payer: BC Managed Care – PPO | Admitting: Vascular Surgery

## 2017-01-18 VITALS — BP 138/74 | HR 58 | Temp 97.6°F | Resp 16 | Ht 63.0 in | Wt 144.0 lb

## 2017-01-18 DIAGNOSIS — I83891 Varicose veins of right lower extremities with other complications: Secondary | ICD-10-CM | POA: Diagnosis not present

## 2017-01-18 DIAGNOSIS — I83893 Varicose veins of bilateral lower extremities with other complications: Secondary | ICD-10-CM | POA: Diagnosis not present

## 2017-01-18 DIAGNOSIS — R609 Edema, unspecified: Secondary | ICD-10-CM | POA: Diagnosis present

## 2017-01-18 NOTE — Progress Notes (Signed)
Subjective:     Patient ID: Rebecca Herman, female   DOB: [birth year], 61 y.o.   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 She is tried long-leg elastic compression stockings 20-30 millimeter gradient as well as elevation and ibuprofen. Her legs have done better this summer since she is out of school and does not require the constant walking and sitting associated with her job. Her symptoms have not completely resolved however and she is continuing to have symptoms which affect her daily living and ability to work. She has no history of DVT thrombophlebitis stasis ulcers or bleeding.  Past Medical History:  Diagnosis Date  . Varicose veins of bilateral lower extremities with pain     Social History  Substance Use Topics  . Smoking status: Never Smoker  . Smokeless tobacco: Never Used  . Alcohol use No    Family History  Problem Relation Age of Onset  . Cancer Mother        breast  . Diabetes Mother   . Hypertension Mother   . Cancer Father     No Known Allergies   Current Outpatient Prescriptions:  .  benzonatate (TESSALON PERLES) 100 MG capsule, Take 2 capsules (200 mg total) by mouth 3 (three) times daily as needed for cough., Disp: 30 capsule, Rfl: 0 .  Calcium Carbonate-Vitamin D (CALCIUM + D PO), Take 1 tablet by mouth 2 (two) times daily., Disp: , Rfl:  .  CINNAMON PO, Take 1,000 mg by mouth daily., Disp: , Rfl:  .  Multiple Vitamin (MULTIVITAMIN) tablet, Take 1 tablet by mouth daily., Disp: , Rfl:  .  TURMERIC PO, Take by mouth., Disp: , Rfl:   Vitals:   01/18/17 1033  BP: 138/74  Pulse: (!) 58  Resp: 16  Temp: 97.6 F (36.4 C)  SpO2: 100%  Weight: 144 lb (65.3 kg)  Height: 5\' 3"  (1.6 m)    Body mass index is 25.51 kg/m.        Review of Systems Denies chest pain, dyspnea on exertion, PND, orthopnea, hemoptysis, claudication.    Objective:   Physical Exam BP 138/74   Pulse (!) 58   Temp 97.6 F (36.4 C)   Resp 16   Ht 5\' 3"  (1.6 m)   Wt 144 lb (65.3 kg)   SpO2 100%   BMI 25.51 kg/m   Gen. well-developed well-nourished female in no apparent distress alert and oriented 3 Lungs no rhonchi or wheezing Right leg with extensive bulging varicosities beginning in the proximal third of the thigh anteriorly extending down the lateral thigh lateral calf and posterior calf are quite large and engorged with 1+ distal edema Left leg with bulging varicosities in the lateral thigh and posterior calf with 1+ edema  Today I ordered a venous duplex exam both legs are reviewed and interpreted. There is no DVT. The right great saphenous vein is absent from previous ablation. The bulging varicosities are supplied by the anterior accessory branch of the right great saphenous vein which does have reflux but is not large caliber. Left leg has reflux in the great saphenous vein but it also is not large caliber but is supplying these painful varicosities     Assessment:     Bilateral painful varicosities status post laser ablation right great saphenous vein in [facility] in the past with successful closure of great saphenous vein but patency of anterior accessory  branch which is small caliber and has gross reflux supplying painful varicosities in the right leg Left leg varicosities are supplied by great saphenous vein which has reflux but not large caliber Bilateral painful varicosities are affecting patient's daily living and ability to work in school and not responded adequately to conservative measures    Plan:     Patient needs bilateral multiple stab phlebectomy-greater than 20 beginning with the right leg to relieve her symptoms and allow her to continue working Will proceed with precertification to perform this in the near future

## 2017-01-24 ENCOUNTER — Telehealth: Payer: Self-pay | Admitting: *Deleted

## 2017-01-24 NOTE — Telephone Encounter (Signed)
Notified Shia Delaine that [place] [facility] [place] reference # [id number] has denied CPT [phone] (right and left legs).  Per [employer] [facility] [place] medical policy, phlebectomy as the sole treatment for varicose veins is not covered.   Offered option of self-pay for performing office surgery (stab phlebectomy).  Patient will call back if she decides to schedule.

## 2017-02-02 ENCOUNTER — Encounter (HOSPITAL_COMMUNITY): Payer: BC Managed Care – PPO

## 2017-02-02 ENCOUNTER — Ambulatory Visit: Payer: BC Managed Care – PPO | Admitting: Vascular Surgery

## 2017-02-24 ENCOUNTER — Telehealth (HOSPITAL_COMMUNITY): Payer: Self-pay | Admitting: *Deleted

## 2017-02-24 NOTE — Telephone Encounter (Signed)
Explained to patient that she did have a bilateral ultrasound exam.

## 2017-08-30 ENCOUNTER — Ambulatory Visit: Payer: BC Managed Care – PPO | Admitting: Family Medicine

## 2017-08-30 ENCOUNTER — Encounter: Payer: Self-pay | Admitting: Family Medicine

## 2017-08-30 ENCOUNTER — Other Ambulatory Visit: Payer: Self-pay

## 2017-08-30 VITALS — BP 132/70 | HR 64 | Temp 98.1°F | Resp 14 | Ht 63.0 in | Wt 148.0 lb

## 2017-08-30 DIAGNOSIS — L089 Local infection of the skin and subcutaneous tissue, unspecified: Secondary | ICD-10-CM

## 2017-08-30 DIAGNOSIS — L729 Follicular cyst of the skin and subcutaneous tissue, unspecified: Secondary | ICD-10-CM | POA: Diagnosis not present

## 2017-08-30 DIAGNOSIS — H6121 Impacted cerumen, right ear: Secondary | ICD-10-CM | POA: Diagnosis not present

## 2017-08-30 MED ORDER — SULFAMETHOXAZOLE-TRIMETHOPRIM 800-160 MG PO TABS: 1.0000 | ORAL_TABLET | Freq: Two times a day (BID) | ORAL | 0 refills | Status: DC

## 2017-08-30 NOTE — Progress Notes (Signed)
   Subjective:    Patient ID: Rebecca Herman, female    DOB: 1956/02/17, 62 y.o.   MRN: [id number]  Patient presents for Ear Pain (x1 week- R ear pain/ pressure in ear- stabbing pain and muffled sounds) and Knot to Groin (x1 day- noted knot by panties wearing against it)   Right ear pain x 1 week, no drainage from ear, had some sinus pressure, no fever, uses q tips , hearing feels muffeled, will get more of a stabbing pain in ear    Knot  Right inner leg Noticed yesterday, no drainage from the area.         Review Of Systems: per above   GEN- denies fatigue, fever, weight loss,weakness, recent illness HEENT- denies eye drainage, change in vision, nasal discharge, CVS- denies chest pain, palpitations RESP- denies SOB, cough, wheeze ABD- denies N/V, change in stools, abd pain Neuro- denies headache, dizziness, syncope, seizure activity       Objective:    BP 132/70   Pulse 64   Temp 98.1 F (36.7 C) (Oral)   Resp 14   Ht 5\' 3"  (1.6 m)   Wt 148 lb (67.1 kg)   SpO2 99%   BMI 26.22 kg/m  GEN- NAD, alert and oriented x3 HEENT- PERRL, EOMI, non injected sclera, pink conjunctiva, MMM, oropharynx clear, no maxillary sinus tenderness, partial wax impaction right canal,hearing grossly in tact , Left TM clear, canal clear  Neck- Supple, no LAD Skin- Right inner thigh - quarter size nodule with pit in center, mild fluctance, TTP, no erythema  Pulses- Radial  2+   Procedure- Incision and Drainage Procedure explained to patient questions answered benefits and risks discussed written consent obtained. Antiseptic-Betadine Anesthesia-lidocaine 1% no Epi Incision performed minimal bleeding, no pus expressed  Patient tolerated procedure well Bandage applied      Assessment & Plan:      Problem List Items Addressed This Visit    None    Visit Diagnoses    Infected cyst of skin    -  Primary   attempted I and D, no pus expressed, will treat with soaks with epson salt and bactrim  advised if ths does not resolve will need to be cut out by surgeon   Relevant Medications   sulfamethoxazole-trimethoprim (BACTRIM DS,SEPTRA DS) 800-160 MG tablet   Impacted cerumen of right ear       s/p IRRIGATION at bedside      Note: This dictation was prepared with Dragon dictation along with smaller phrase technology. Any transcriptional errors that result from this process are unintentional.

## 2017-08-30 NOTE — Patient Instructions (Signed)
Take antibiotics Soak in epson salt  If this does not go down completley then call and you will be referred to a surgeon F/u as needed

## 2017-08-31 ENCOUNTER — Encounter: Payer: Self-pay | Admitting: Family Medicine

## 2017-09-14 ENCOUNTER — Telehealth: Payer: Self-pay | Admitting: *Deleted

## 2017-09-14 DIAGNOSIS — L729 Follicular cyst of the skin and subcutaneous tissue, unspecified: Principal | ICD-10-CM

## 2017-09-14 DIAGNOSIS — L089 Local infection of the skin and subcutaneous tissue, unspecified: Secondary | ICD-10-CM

## 2017-09-14 NOTE — Telephone Encounter (Signed)
Received call from patient.   Reports that infected cyst on groin has not resolved. States that pain and tenderness has been greatly reduced, but has not gone away.   Per MD, refer to general surgery for evaluation and treatment. Referral orders placed.   Call placed to patient and patient made aware. Patient states that she does not want to go to have area removed at this time. Will call back if she changes her mind.

## 2017-10-22 ENCOUNTER — Emergency Department
Admission: EM | Admit: 2017-10-22 | Discharge: 2017-10-22 | Disposition: A | Discharge status: Home / Self Care | Payer: BC Managed Care – PPO | Attending: Emergency Medicine | Admitting: Emergency Medicine

## 2017-10-22 ENCOUNTER — Emergency Department: Payer: BC Managed Care – PPO

## 2017-10-22 ENCOUNTER — Other Ambulatory Visit: Payer: Self-pay

## 2017-10-22 ENCOUNTER — Encounter: Payer: Self-pay | Admitting: Emergency Medicine

## 2017-10-22 DIAGNOSIS — S0993XA Unspecified injury of face, initial encounter: Secondary | ICD-10-CM | POA: Diagnosis present

## 2017-10-22 DIAGNOSIS — S01412A Laceration without foreign body of left cheek and temporomandibular area, initial encounter: Secondary | ICD-10-CM | POA: Insufficient documentation

## 2017-10-22 DIAGNOSIS — Y998 Other external cause status: Secondary | ICD-10-CM | POA: Insufficient documentation

## 2017-10-22 DIAGNOSIS — Y92009 Unspecified place in unspecified non-institutional (private) residence as the place of occurrence of the external cause: Secondary | ICD-10-CM | POA: Insufficient documentation

## 2017-10-22 DIAGNOSIS — Y9389 Activity, other specified: Secondary | ICD-10-CM | POA: Diagnosis not present

## 2017-10-22 DIAGNOSIS — W010XXA Fall on same level from slipping, tripping and stumbling without subsequent striking against object, initial encounter: Secondary | ICD-10-CM | POA: Insufficient documentation

## 2017-10-22 DIAGNOSIS — S0181XA Laceration without foreign body of other part of head, initial encounter: Secondary | ICD-10-CM

## 2017-10-22 HISTORY — DX: Age-related osteoporosis without current pathological fracture: M81.0

## 2017-10-22 MED ORDER — OXYCODONE-ACETAMINOPHEN 5-325 MG PO TABS
ORAL_TABLET | ORAL | Status: AC
  Administered 2017-10-22: 1 via ORAL
  Filled 2017-10-22: qty 1

## 2017-10-22 MED ORDER — ONDANSETRON 4 MG PO TBDP: 4.0000 mg | ORAL_TABLET | Freq: Three times a day (TID) | ORAL | 0 refills | Status: DC | PRN

## 2017-10-22 MED ORDER — OXYCODONE-ACETAMINOPHEN 5-325 MG PO TABS: 1.0000 | ORAL_TABLET | Freq: Three times a day (TID) | ORAL | 0 refills | Status: DC | PRN

## 2017-10-22 MED ORDER — PHENYLEPHRINE 200 MCG/ML FOR PRIAPISM / HYPOTENSION: 50.0000 ug | Freq: Once | INTRAMUSCULAR | Status: DC

## 2017-10-22 MED ORDER — OXYCODONE-ACETAMINOPHEN 5-325 MG PO TABS
1.0000 | ORAL_TABLET | Freq: Once | ORAL | Status: AC
  Administered 2017-10-22: 1 via ORAL

## 2017-10-22 MED ORDER — SODIUM CHLORIDE 0.9 % IV SOLN: Freq: Once | INTRAVENOUS | Status: DC

## 2017-10-22 MED ORDER — ONDANSETRON 4 MG PO TBDP
4.0000 mg | ORAL_TABLET | Freq: Once | ORAL | Status: AC
  Administered 2017-10-22: 4 mg via ORAL

## 2017-10-22 MED ORDER — LIDOCAINE-EPINEPHRINE (PF) 1 %-1:200000 IJ SOLN
10.0000 mL | Freq: Once | INTRAMUSCULAR | Status: AC
  Administered 2017-10-22: 10 mL via INTRADERMAL
  Filled 2017-10-22: qty 30

## 2017-10-22 MED ORDER — ONDANSETRON 4 MG PO TBDP
ORAL_TABLET | ORAL | Status: AC
  Administered 2017-10-22: 4 mg via ORAL
  Filled 2017-10-22: qty 1

## 2017-10-22 MED ORDER — ONDANSETRON 4 MG PO TBDP
ORAL_TABLET | ORAL | Status: AC
  Filled 2017-10-22: qty 1

## 2017-10-22 MED ORDER — PHENYLEPHRINE HCL 0.5 % NA SOLN: 1.0000 [drp] | Freq: Once | NASAL | Status: DC

## 2017-10-22 NOTE — ED Provider Notes (Addendum)
[facility] Emergency Department Provider Note       Time seen: ----------------------------------------- 7:14 PM on 10/22/2017 -----------------------------------------   I have reviewed the triage vital signs and the nursing notes.  HISTORY   Chief Complaint Facial Laceration    HPI Rebecca Herman is a 62 y.o. female with a history of osteoporosis who presents to the ED for a laceration.  Patient states she tripped and fell at home and hit o'clock which broke and cut her face to the left side of her nose.  While she was being evaluated here she saw the blood and had a syncopal event.  She was sent back to the ER room for evaluation concerning same, she is not complaining of significant facial pain at this time.  Past Medical History:  Diagnosis Date  . Osteoporosis   . Varicose veins of bilateral lower extremities with pain     Patient Active Problem List   Diagnosis Date Noted  . Varicose veins of bilateral lower extremities with other complications 14/97/0263    Past Surgical History:  Procedure Laterality Date  . TONSILLECTOMY    . TUBAL LIGATION    . varicose veins      Allergies Patient has no known allergies.  Social History Social History   Tobacco Use  . Smoking status: Never Smoker  . Smokeless tobacco: Never Used  Substance Use Topics  . Alcohol use: No  . Drug use: No   Review of Systems Constitutional: Negative for fever. Eyes: Negative for vision changes ENT: Positive for laceration Cardiovascular: Negative for chest pain. Respiratory: Negative for shortness of breath. Gastrointestinal: Negative for abdominal pain, vomiting and diarrhea. Musculoskeletal: Negative for back pain. Skin: Positive for facial laceration Neurological: Positive for weakness  All systems negative/normal/unremarkable except as stated in the HPI  ____________________________________________   PHYSICAL EXAM:  VITAL SIGNS: ED Triage Vitals   Enc Vitals Group     BP 10/22/17 1817 (!) 97/56     Pulse Rate 10/22/17 1817 (!) 111     Resp 10/22/17 1817 20     Temp 10/22/17 1817 98.7 F (37.1 C)     Temp Source 10/22/17 1817 Oral     SpO2 10/22/17 1817 95 %     Weight 10/22/17 1821 146 lb (66.2 kg)     Height 10/22/17 1821 5\' 3"  (1.6 m)     Head Circumference --      Peak Flow --      Pain Score 10/22/17 1820 5     Pain Loc --      Pain Edu? --      Excl. in [place]? --    Constitutional: Alert and oriented.  No distress Eyes: Conjunctivae are normal. Normal extraocular movements. ENT   Head: Normocephalic and atraumatic.   Nose: No congestion/rhinnorhea.  3 cm facial laceration with mild bleeding, laceration extends lateral to medial and extends through the left naris   Mouth/Throat: Mucous membranes are moist.   Neck: No stridor. Cardiovascular: Normal rate, regular rhythm. No murmurs, rubs, or gallops. Respiratory: Normal respiratory effort without tachypnea nor retractions. Breath sounds are clear and equal bilaterally. No wheezes/rales/rhonchi. Gastrointestinal: Soft and nontender. Normal bowel sounds Musculoskeletal: Nontender with normal range of motion in extremities. No lower extremity tenderness nor edema. Neurologic:  Normal speech and language. No gross focal neurologic deficits are appreciated.  Skin: Laceration to the left side of her nose as noted above. ____________________________________________  ED COURSE:  As part of my medical decision  making, I reviewed the following data within the [place] History obtained from family if available, nursing notes, old chart and ekg, as well as notes from prior ED visits. Patient presented for facial laceration and syncope likely vasovagal due to the side of her blood, patient has declined IV access or any further testing other than laceration repair.   Marland Kitchen.Laceration Repair Date/Time: 10/22/2017 7:16 PM Performed by: Earleen Newport,  MD Authorized by: Earleen Newport, MD   Consent:    Consent obtained:  Verbal   Consent given by:  Patient   Risks discussed:  Infection, pain, retained foreign body, poor cosmetic result and poor wound healing Anesthesia (see MAR for exact dosages):    Anesthesia method:  Local infiltration   Local anesthetic:  Lidocaine 1% WITH epi Laceration details:    Location:  Face   Face location:  L cheek   Length (cm):  2.5   Depth (mm):  5 Repair type:    Repair type:  Simple Exploration:    Hemostasis achieved with:  Direct pressure   Wound exploration: entire depth of wound probed and visualized     Contaminated: no   Treatment:    Area cleansed with:  Saline   Amount of cleaning:  Extensive   Irrigation solution:  Sterile saline   Visualized foreign bodies/material removed: no   Skin repair:    Repair method:  Sutures   Suture size:  6-0   Suture technique:  Running   Number of sutures:  11 Approximation:    Approximation:  Close Post-procedure details:    Dressing:  Sterile dressing   Patient tolerance of procedure:  Tolerated well, no immediate complications   ____________________________________________  DIFFERENTIAL DIAGNOSIS   Laceration, vasovagal syncope  FINAL ASSESSMENT AND PLAN  Facial laceration extending into the left naris   Plan: The patient had presented for fall with facial laceration.  Wound was repaired as dictated above, I will advise follow-up in 5 to 7 days for suture removal.   Laurence Aly, MD   Note: This note was generated in part or whole with voice recognition software. Voice recognition is usually quite accurate but there are transcription errors that can and very often do occur. I apologize for any typographical errors that were not detected and corrected.     Earleen Newport, MD 10/22/17 Rebecca Herman    Earleen Newport, MD 10/22/17 2040

## 2017-10-22 NOTE — ED Notes (Signed)
ED Provider at bedside for facial lac repair.

## 2017-10-22 NOTE — ED Notes (Signed)
Patient transported to CT 

## 2017-10-22 NOTE — ED Notes (Signed)
Pt refused IV insertion and fluid bolus

## 2017-10-22 NOTE — ED Notes (Signed)
Face and hand cleansed of blood.  Blood suctioned out of mouth.  Pt tolerated well.  Denies discomfort at this time.  Updated on plan of care.  Awaiting transport to CT.

## 2017-10-22 NOTE — ED Triage Notes (Signed)
Pt via pov from home after tripping and falling. She hit a clock, which broke and cut her face. Pt denies lock. Pt alert & oriented with NAD noted.

## 2017-10-22 NOTE — ED Notes (Signed)
Dr. Gwyndolyn Saxon aware pt is refusing IVF at this time.

## 2017-10-22 NOTE — ED Notes (Signed)
ED Provider at bedside.  Nasal packing placed L nare.

## 2017-10-24 ENCOUNTER — Other Ambulatory Visit: Payer: Self-pay | Admitting: Family Medicine

## 2017-10-24 ENCOUNTER — Encounter: Payer: Self-pay | Admitting: Family Medicine

## 2017-10-24 ENCOUNTER — Ambulatory Visit: Payer: BC Managed Care – PPO | Admitting: Family Medicine

## 2017-10-24 VITALS — BP 150/90 | HR 98 | Temp 97.8°F | Resp 14 | Ht 63.5 in | Wt 143.0 lb

## 2017-10-24 DIAGNOSIS — S0121XA Laceration without foreign body of nose, initial encounter: Secondary | ICD-10-CM

## 2017-10-24 DIAGNOSIS — R040 Epistaxis: Secondary | ICD-10-CM | POA: Diagnosis not present

## 2017-10-24 MED ORDER — SULFAMETHOXAZOLE-TRIMETHOPRIM 800-160 MG PO TABS: 1.0000 | ORAL_TABLET | Freq: Two times a day (BID) | ORAL | 0 refills | Status: DC

## 2017-10-24 NOTE — Progress Notes (Signed)
Subjective:    Patient ID: Rebecca Herman, female    DOB: 04/17/1956, 62 y.o.   MRN: [id number]  HPI Patient fell Saturday and sustained a laceration to the left side of her nose just superior to the nare.  The laceration went through into the nostril.  It was closed with sutures in the emergency room and the left nostril was packed with Rhino Rocket.  She is here today for follow-up.  The packing is removed.  The visualized left nostril shows blood completely covering the nasal septum as well as blood covering the lateral wall of the nostril.  Patient reports that she has been having trickle epistaxis coming from her left nostril as well as from her right nostril Saturday and Sunday.  the lateral wall of the nostril roughly in the location of the sutures was cauterized using a silver nitrate stick.  This was uncomfortable for the patient.  Afterwards lidocaine with epinephrine was soaked onto a 2 x 2 gauze which was then placed in the nostril to allow topical anesthesia as well as vasoconstriction to permit more thorough examination.  This was placed at roughly 9:35. Past Medical History:  Diagnosis Date  . Osteoporosis   . Varicose veins of bilateral lower extremities with pain    Past Surgical History:  Procedure Laterality Date  . TONSILLECTOMY    . TUBAL LIGATION    . varicose veins     Current Outpatient Medications on File Prior to Visit  Medication Sig Dispense Refill  . Apple Cider Vinegar 300 MG TABS Take by mouth.    . Calcium Carbonate-Vitamin D (CALCIUM + D PO) Take 1 tablet by mouth 2 (two) times daily.    Marland Kitchen CINNAMON PO Take 1,000 mg by mouth daily.    . Multiple Vitamin (MULTIVITAMIN) tablet Take 1 tablet by mouth daily.    . ondansetron (ZOFRAN ODT) 4 MG disintegrating tablet Take 1 tablet (4 mg total) by mouth every 8 (eight) hours as needed for nausea or vomiting. 20 tablet 0  . oxyCODONE-acetaminophen (PERCOCET) 5-325 MG tablet Take 1-2 tablets by mouth every 8 (eight) hours  as needed. 12 tablet 0  . TURMERIC PO Take by mouth.     No current facility-administered medications on file prior to visit.    No Known Allergies Social History   Socioeconomic History  . Marital status: Married    Spouse name: Not on file  . Number of children: Not on file  . Years of education: Not on file  . Highest education level: Not on file  Occupational History  . Not on file  Social Needs  . Financial resource strain: Not on file  . Food insecurity:    Worry: Not on file    Inability: Not on file  . Transportation needs:    Medical: Not on file    Non-medical: Not on file  Tobacco Use  . Smoking status: Never Smoker  . Smokeless tobacco: Never Used  Substance and Sexual Activity  . Alcohol use: No  . Drug use: No  . Sexual activity: Not on file  Lifestyle  . Physical activity:    Days per week: Not on file    Minutes per session: Not on file  . Stress: Not on file  Relationships  . Social connections:    Talks on phone: Not on file    Gets together: Not on file    Attends religious service: Not on file    Active member of club  or organization: Not on file    Attends meetings of clubs or organizations: Not on file    Relationship status: Not on file  . Intimate partner violence:    Fear of current or ex partner: Not on file    Emotionally abused: Not on file    Physically abused: Not on file    Forced sexual activity: Not on file  Other Topics Concern  . Not on file  Social History Narrative  . Not on file      Review of Systems  All other systems reviewed and are negative.      Objective:   Physical Exam  Constitutional: She appears well-developed and well-nourished. No distress.  HENT:  Nose: Nose lacerations present. No nasal septal hematoma. Epistaxis is observed. Foreign body is present.    Cardiovascular: Normal rate, regular rhythm and normal heart sounds. Exam reveals no gallop and no friction rub.  No murmur  heard. Pulmonary/Chest: Effort normal and breath sounds normal. No stridor. No respiratory distress. She has no wheezes. She has no rales.  Skin: She is not diaphoretic.  Vitals reviewed.         Assessment & Plan:  Nasal laceration, initial encounter  Epistaxis  The gauze soaked with epinephrine was removed.  There was visible bleeding coming from the anterior nasal septum.  This was unable to be cauterized with silver nitrate.  Therefore the nose was repacked with a Rhino Rocket soaked in normal saline.  I recommended leaving the Rhino Rocket in place for 48 hours and then removing on Thursday.  Given the prolonged packing, I have recommended that she start Bactrim double strength tablets 1 p.o. twice daily until the packing is removed.  Reassess on Thursday the ability to remove sutures.  Friday will be 6 days.  I anticipate that it would likely be removed on Monday which would be 9 days.  I did recommend that she could discontinue applying Neosporin to the surface of a cut in an effort to try to allow the skin to firm up and heal more quickly to allow suture removal on Friday to minimize scarring.

## 2017-10-25 DIAGNOSIS — R040 Epistaxis: Secondary | ICD-10-CM | POA: Insufficient documentation

## 2017-10-27 ENCOUNTER — Ambulatory Visit: Payer: BC Managed Care – PPO | Admitting: Family Medicine

## 2017-10-27 ENCOUNTER — Encounter: Payer: Self-pay | Admitting: Family Medicine

## 2017-10-27 VITALS — BP 132/78 | HR 60 | Temp 98.2°F | Resp 16 | Ht 63.5 in | Wt 143.0 lb

## 2017-10-27 DIAGNOSIS — S0121XD Laceration without foreign body of nose, subsequent encounter: Secondary | ICD-10-CM

## 2017-10-27 NOTE — Progress Notes (Signed)
Subjective:    Patient ID: Rebecca Herman, female    DOB: 1955-10-03, 62 y.o.   MRN: [id number]  HPI  10/24/17 Patient fell Saturday and sustained a laceration to the left side of her nose just superior to the nare.  The laceration went through into the nostril.  It was closed with sutures in the emergency room and the left nostril was packed with Rhino Rocket.  She is here today for follow-up.  The packing is removed.  The visualized left nostril shows blood completely covering the nasal septum as well as blood covering the lateral wall of the nostril.  Patient reports that she has been having trickle epistaxis coming from her left nostril as well as from her right nostril Saturday and Sunday.  the lateral wall of the nostril roughly in the location of the sutures was cauterized using a silver nitrate stick.  This was uncomfortable for the patient.  Afterwards lidocaine with epinephrine was soaked onto a 2 x 2 gauze which was then placed in the nostril to allow topical anesthesia as well as vasoconstriction to permit more thorough examination.  This was placed at roughly 9:35.  At that time, my plan was: The gauze soaked with epinephrine was removed.  There was visible bleeding coming from the anterior nasal septum.  This was unable to be cauterized with silver nitrate.  Therefore the nose was repacked with a Rhino Rocket soaked in normal saline.  I recommended leaving the Rhino Rocket in place for 48 hours and then removing on Thursday.  Given the prolonged packing, I have recommended that she start Bactrim double strength tablets 1 p.o. twice daily until the packing is removed.  Reassess on Thursday the ability to remove sutures.  Friday will be 6 days.  I anticipate that it would likely be removed on Monday which would be 9 days.  I did recommend that she could discontinue applying Neosporin to the surface of a cut in an effort to try to allow the skin to firm up and heal more quickly to allow suture removal  on Friday to minimize scarring.   10/27/17 After I finished seeing the patient, both she and her husband requested a referral to ENT.  She did not want to have the packing.  She was hoping the ENT could cauterize the area more effectively with less pain than I could.  Therefore my staff arranged an appointment with her to meet with an ENT physician.  ENT left the packing in place.  The following day she experienced epistaxis from the right nostril.  She went back to ENT who cauterized the proximal portion of the right anterior nasal septum.  However the packing is still in place in her left nostril that I placed on the sixth.  The patient has not had no further bleeding coming from the left nostril since when I saw her.  The stitches are also still intact.  She has an appointment to see ENT tomorrow for a follow-up.   Past Medical History:  Diagnosis Date  . Osteoporosis   . Varicose veins of bilateral lower extremities with pain    Past Surgical History:  Procedure Laterality Date  . TONSILLECTOMY    . TUBAL LIGATION    . varicose veins     Current Outpatient Medications on File Prior to Visit  Medication Sig Dispense Refill  . Apple Cider Vinegar 300 MG TABS Take by mouth.    . Calcium Carbonate-Vitamin D (CALCIUM + D PO)  Take 1 tablet by mouth 2 (two) times daily.    Marland Kitchen CINNAMON PO Take 1,000 mg by mouth daily.    . Multiple Vitamin (MULTIVITAMIN) tablet Take 1 tablet by mouth daily.    . ondansetron (ZOFRAN ODT) 4 MG disintegrating tablet Take 1 tablet (4 mg total) by mouth every 8 (eight) hours as needed for nausea or vomiting. 20 tablet 0  . oxyCODONE-acetaminophen (PERCOCET) 5-325 MG tablet Take 1-2 tablets by mouth every 8 (eight) hours as needed. 12 tablet 0  . sulfamethoxazole-trimethoprim (BACTRIM DS,SEPTRA DS) 800-160 MG tablet Take 1 tablet by mouth 2 (two) times daily. 14 tablet 0  . TURMERIC PO Take by mouth.     No current facility-administered medications on file prior to  visit.    No Known Allergies Social History   Socioeconomic History  . Marital status: Married    Spouse name: Not on file  . Number of children: Not on file  . Years of education: Not on file  . Highest education level: Not on file  Occupational History  . Not on file  Social Needs  . Financial resource strain: Not on file  . Food insecurity:    Worry: Not on file    Inability: Not on file  . Transportation needs:    Medical: Not on file    Non-medical: Not on file  Tobacco Use  . Smoking status: Never Smoker  . Smokeless tobacco: Never Used  Substance and Sexual Activity  . Alcohol use: No  . Drug use: No  . Sexual activity: Not on file  Lifestyle  . Physical activity:    Days per week: Not on file    Minutes per session: Not on file  . Stress: Not on file  Relationships  . Social connections:    Talks on phone: Not on file    Gets together: Not on file    Attends religious service: Not on file    Active member of club or organization: Not on file    Attends meetings of clubs or organizations: Not on file    Relationship status: Not on file  . Intimate partner violence:    Fear of current or ex partner: Not on file    Emotionally abused: Not on file    Physically abused: Not on file    Forced sexual activity: Not on file  Other Topics Concern  . Not on file  Social History Narrative  . Not on file      Review of Systems  All other systems reviewed and are negative.      Objective:   Physical Exam  Constitutional: She appears well-developed and well-nourished. No distress.  HENT:  Nose: No nose lacerations or nasal septal hematoma. No epistaxis. Foreign body is present.    Cardiovascular: Normal rate, regular rhythm and normal heart sounds. Exam reveals no gallop and no friction rub.  No murmur heard. Pulmonary/Chest: Effort normal and breath sounds normal. No stridor. No respiratory distress. She has no wheezes. She has no rales.  Skin: She is  not diaphoretic.  Vitals reviewed.         Assessment & Plan:  Nasal laceration, subsequent encounter  Packing is removed.  Epistaxis has resolved.  Patient has an appointment tomorrow to see her ENT physician for suture removal according to her.  Therefore I will defer to his expertise regarding removal of sutures.  Epistaxis has resolved.  I did recommend that the patient use topical Afrin nasal spray  twice daily for the next 3 days to prevent future epistaxis until the area has healed

## 2017-10-28 ENCOUNTER — Ambulatory Visit: Payer: BC Managed Care – PPO | Admitting: Family Medicine

## 2018-04-24 ENCOUNTER — Telehealth: Payer: Self-pay | Admitting: Family Medicine

## 2018-04-24 NOTE — Telephone Encounter (Signed)
Calling about lab results.

## 2018-04-26 NOTE — Telephone Encounter (Signed)
LMTRC

## 2018-04-26 NOTE — Telephone Encounter (Signed)
Have you seen any labs on this pt from her OB/GYN?

## 2018-04-27 NOTE — Telephone Encounter (Signed)
I have not.  They are not in my red folder.

## 2018-08-24 ENCOUNTER — Ambulatory Visit: Payer: BC Managed Care – PPO | Admitting: Family Medicine

## 2018-08-24 ENCOUNTER — Encounter: Payer: Self-pay | Admitting: Family Medicine

## 2018-08-24 VITALS — BP 144/80 | HR 60 | Temp 97.6°F | Resp 15 | Ht 63.5 in | Wt 149.2 lb

## 2018-08-24 DIAGNOSIS — M545 Low back pain, unspecified: Secondary | ICD-10-CM

## 2018-08-24 DIAGNOSIS — R82998 Other abnormal findings in urine: Secondary | ICD-10-CM | POA: Diagnosis not present

## 2018-08-24 DIAGNOSIS — J069 Acute upper respiratory infection, unspecified: Secondary | ICD-10-CM | POA: Diagnosis not present

## 2018-08-24 DIAGNOSIS — H9201 Otalgia, right ear: Secondary | ICD-10-CM

## 2018-08-24 LAB — URINALYSIS, ROUTINE W REFLEX MICROSCOPIC
BACTERIA UA: NONE SEEN /HPF
Bilirubin Urine: NEGATIVE
Glucose, UA: NEGATIVE
HGB URINE DIPSTICK: NEGATIVE
Ketones, ur: NEGATIVE
Nitrite: NEGATIVE
PH: 7 (ref 5.0–8.0)
PROTEIN: NEGATIVE
RBC / HPF: NONE SEEN /HPF (ref 0–2)
Specific Gravity, Urine: 1.02 (ref 1.001–1.03)

## 2018-08-24 LAB — MICROSCOPIC MESSAGE

## 2018-08-24 NOTE — Patient Instructions (Addendum)
Ear pain may be referred from throat and URI, or may be some Eustachian tube dysfunction, it may improve with intranasal steroid spray like flonase.  Do 2 sprays daily for 2 weeks.  If any worsening or fever may need to be rechecked.  Upper Respiratory Infection, Adult An upper respiratory infection (URI) affects the nose, throat, and upper air passages. URIs are caused by germs (viruses). The most common type of URI is often called "the common cold." Medicines cannot cure URIs, but you can do things at home to relieve your symptoms. URIs usually get better within 7-10 days. Follow these instructions at home: Activity  Rest as needed.  If you have a fever, stay home from work or school until your fever is gone, or until your doctor says you may return to work or school. ? You should stay home until you cannot spread the infection anymore (you are not contagious). ? Your doctor may have you wear a face mask so you have less risk of spreading the infection. Relieving symptoms  Gargle with a salt-water mixture 3-4 times a day or as needed. To make a salt-water mixture, completely dissolve -1 tsp of salt in 1 cup of warm water.  Use a cool-mist humidifier to add moisture to the air. This can help you breathe more easily. Eating and drinking   Drink enough fluid to keep your pee (urine) pale yellow.  Eat soups and other clear broths. General instructions   Take over-the-counter and prescription medicines only as told by your doctor. These include cold medicines, fever reducers, and cough suppressants.  Do not use any products that contain nicotine or tobacco. These include cigarettes and e-cigarettes. If you need help quitting, ask your doctor.  Avoid being where people are smoking (avoid secondhand smoke).  Make sure you get regular shots and get the flu shot every year.  Keep all follow-up visits as told by your doctor. This is important. How to avoid spreading infection to  others   Wash your hands often with soap and water. If you do not have soap and water, use hand sanitizer.  Avoid touching your mouth, face, eyes, or nose.  Cough or sneeze into a tissue or your sleeve or elbow. Do not cough or sneeze into your hand or into the air. Contact a doctor if:  You are getting worse, not better.  You have any of these: ? A fever. ? Chills. ? Brown or red mucus in your nose. ? Yellow or brown fluid (discharge)coming from your nose. ? Pain in your face, especially when you bend forward. ? Swollen neck glands. ? Pain with swallowing. ? White areas in the back of your throat. Get help right away if:  You have shortness of breath that gets worse.  You have very bad or constant: ? Headache. ? Ear pain. ? Pain in your forehead, behind your eyes, and over your cheekbones (sinus pain). ? Chest pain.  You have long-lasting (chronic) lung disease along with any of these: ? Wheezing. ? Long-lasting cough. ? Coughing up blood. ? A change in your usual mucus.  You have a stiff neck.  You have changes in your: ? Vision. ? Hearing. ? Thinking. ? Mood. Summary  An upper respiratory infection (URI) is caused by a germ called a virus. The most common type of URI is often called "the common cold."  URIs usually get better within 7-10 days.  Take over-the-counter and prescription medicines only as told by your doctor. This  information is not intended to replace advice given to you by your health care provider. Make sure you discuss any questions you have with your health care provider. Document Released: 11/24/2007 Document Revised: 01/28/2017 Document Reviewed: 01/28/2017 Elsevier Interactive Patient Education  2019 [place], Adult An earache, or ear pain, can be caused by many things, including:  An infection.  Ear wax buildup.  Ear pressure.  Something in the ear that should not be there (foreign body).  A sore  throat.  Tooth problems.  Jaw problems. Treatment of the earache will depend on the cause. If the cause is not clear or cannot be determined, you may need to watch your symptoms until your earache goes away or until a cause is found. Follow these instructions at home: Pay attention to any changes in your symptoms. Take these actions to help with your pain:  Take or apply over-the-counter and prescription medicines only as told by your health care provider.  If you were prescribed an antibiotic medicine, use it as told by your health care provider. Do not stop using the antibiotic even if you start to feel better.  Do not put anything in your ear other than medicine that is prescribed by your health care provider.  If directed, apply heat to the affected area as often as told by your health care provider. Use the heat source that your health care provider recommends, such as a moist heat pack or a heating pad. ? Place a towel between your skin and the heat source. ? Leave the heat on for 20-30 minutes. ? Remove the heat if your skin turns bright red. This is especially important if you are unable to feel pain, heat, or cold. You may have a greater risk of getting burned.  If directed, put ice on the ear: ? Put ice in a plastic bag. ? Place a towel between your skin and the bag. ? Leave the ice on for 20 minutes, 2-3 times a day.  Try resting in an upright position instead of lying down. This may help to reduce pressure in your ear and relieve pain.  Chew gum if it helps to relieve your ear pain.  Treat any allergies as told by your health care provider.  Keep all follow-up visits as told by your health care provider. This is important. Contact a health care provider if:  Your pain does not improve within 2 days.  Your earache gets worse.  You have new symptoms.  You have a fever. Get help right away if:  You have a severe headache.  You have a stiff neck.  You have  trouble swallowing.  You have redness or swelling behind your ear.  You have fluid or blood coming from your ear.  You have hearing loss.  You feel dizzy. This information is not intended to replace advice given to you by your health care provider. Make sure you discuss any questions you have with your health care provider. Document Released: 01/23/2004 Document Revised: 02/03/2016 Document Reviewed: 12/01/2015 Elsevier Interactive Patient Education  [employer].

## 2018-08-24 NOTE — Progress Notes (Signed)
Patient ID: Rebecca Herman, female    DOB: 1956/03/27, 63 y.o.   MRN: [id number]  PCP: Susy Frizzle, MD  Chief Complaint  Patient presents with  . Ear Pain    Patient has c/o lower back pain , and dark urine. Also has c/o right ear pain. Onset of ear pain 1.5 weeks, back pain onset 2 days ago.  . Back Pain    Subjective:   Rebecca Herman is a 63 y.o. female, presents to clinic with CC of feeling like she's coming down with something.  Her urine is darker, she is constipated, has some right ear pain that is sharp.  Also has some back pain b/l low back w/o injury or strain.   She denies hematuria, urinary frequency or urgency, no abd pain, flank pain, N, V, D, fever, chills, sweats.  She does say that her "eyes feel hot, you know when you feel them hot like a fever in your eye, like that."  She is very naturopathic, drinks ample water and talks a variety of supplements and vitamins.   No cough, wheeze, HA, rash, fatigue.     Patient Active Problem List   Diagnosis Date Noted  . Varicose veins of bilateral lower extremities with other complications 69/[id number]/8938     Prior to Admission medications   Medication Sig Start Date End Date Taking? Authorizing Provider  Apple Cider Vinegar 300 MG TABS Take by mouth.   Yes [provider]  Calcium Carbonate-Vitamin D (CALCIUM + D PO) Take 1 tablet by mouth 2 (two) times daily.   Yes [provider]  CINNAMON PO Take 1,000 mg by mouth daily.   Yes [provider]  ELDERBERRY PO Take by mouth.   Yes [provider]  Multiple Vitamin (MULTIVITAMIN) tablet Take 1 tablet by mouth daily.   Yes [provider]  TURMERIC PO Take by mouth.   Yes [provider]     No Known Allergies   Family History  Problem Relation Age of Onset  . Cancer Mother        breast  . Diabetes Mother   . Hypertension Mother   . Cancer Father      Social History   Socioeconomic History  . Marital  status: Married    Spouse name: Not on file  . Number of children: Not on file  . Years of education: Not on file  . Highest education level: Not on file  Occupational History  . Not on file  Social Needs  . Financial resource strain: Not on file  . Food insecurity:    Worry: Not on file    Inability: Not on file  . Transportation needs:    Medical: Not on file    Non-medical: Not on file  Tobacco Use  . Smoking status: Never Smoker  . Smokeless tobacco: Never Used  Substance and Sexual Activity  . Alcohol use: No  . Drug use: No  . Sexual activity: Not on file  Lifestyle  . Physical activity:    Days per week: Not on file    Minutes per session: Not on file  . Stress: Not on file  Relationships  . Social connections:    Talks on phone: Not on file    Gets together: Not on file    Attends religious service: Not on file    Active member of club or organization: Not on file    Attends meetings of clubs or organizations: Not  on file    Relationship status: Not on file  . Intimate partner violence:    Fear of current or ex partner: Not on file    Emotionally abused: Not on file    Physically abused: Not on file    Forced sexual activity: Not on file  Other Topics Concern  . Not on file  Social History Narrative  . Not on file     Review of Systems  Constitutional: Negative.   HENT: Negative.   Eyes: Negative.   Respiratory: Negative.   Cardiovascular: Negative.   Gastrointestinal: Negative.   Endocrine: Negative.   Genitourinary: Negative.   Musculoskeletal: Negative.   Skin: Negative.   Allergic/Immunologic: Negative.   Neurological: Negative.   Hematological: Negative.   Psychiatric/Behavioral: Negative.   All other systems reviewed and are negative.      Objective:    Vitals:   08/24/18 1500  BP: (!) 144/80  Pulse: 60  Resp: 15  Temp: 97.6 F (36.4 C)  TempSrc: Oral  SpO2: 98%  Weight: 149 lb 4 oz (67.7 kg)  Height: 5' 3.5" (1.613 m)       Physical Exam Vitals signs and nursing note reviewed.  Constitutional:      General: She is not in acute distress.    Appearance: Normal appearance. She is well-developed. She is not ill-appearing, toxic-appearing or diaphoretic.  HENT:     Head: Normocephalic and atraumatic.     Right Ear: Hearing, tympanic membrane, ear canal and external ear normal. No drainage, swelling or tenderness. No middle ear effusion. Tympanic membrane is not erythematous.     Left Ear: Hearing, tympanic membrane, ear canal and external ear normal. No drainage, swelling or tenderness.  No middle ear effusion. Tympanic membrane is not erythematous.     Ears:     Comments: Mild dullness to right TM, no erythema, no purulence    Nose: Mucosal edema (mildly erythematous) present. No congestion or rhinorrhea.     Right Sinus: No maxillary sinus tenderness or frontal sinus tenderness.     Left Sinus: No maxillary sinus tenderness or frontal sinus tenderness.     Mouth/Throat:     Mouth: Mucous membranes are moist. Mucous membranes are not pale.     Pharynx: Uvula midline. Posterior oropharyngeal erythema (mild injection) present. No oropharyngeal exudate or uvula swelling.     Tonsils: No tonsillar abscesses.  Eyes:     General:        Right eye: No discharge.        Left eye: No discharge.     Conjunctiva/sclera: Conjunctivae normal.     Pupils: Pupils are equal, round, and reactive to light.  Neck:     Musculoskeletal: Normal range of motion and neck supple. No neck rigidity.     Trachea: No tracheal deviation.  Cardiovascular:     Rate and Rhythm: Normal rate and regular rhythm.     Pulses: Normal pulses.     Heart sounds: Normal heart sounds. No murmur. No friction rub. No gallop.   Pulmonary:     Effort: Pulmonary effort is normal. No respiratory distress.     Breath sounds: No stridor. No wheezing, rhonchi or rales.  Abdominal:     General: Bowel sounds are normal. There is no distension.      Palpations: Abdomen is soft.     Tenderness: There is no abdominal tenderness. There is no right CVA tenderness or left CVA tenderness.  Musculoskeletal: Normal range of motion.  Cervical back: Normal. She exhibits normal range of motion, no tenderness and no bony tenderness.     Thoracic back: Normal. She exhibits normal range of motion, no tenderness and no bony tenderness.     Lumbar back: Normal. She exhibits normal range of motion, no tenderness and no bony tenderness.  Lymphadenopathy:     Cervical: No cervical adenopathy.  Skin:    General: Skin is warm and dry.     Capillary Refill: Capillary refill takes less than 2 seconds.     Coloration: Skin is not pale.     Findings: No rash.  Neurological:     Mental Status: She is alert.     Motor: No weakness or abnormal muscle tone.     Coordination: Coordination normal.     Gait: Gait normal.  Psychiatric:        Behavior: Behavior normal.      Results for orders placed or performed in visit on 08/24/18  Urinalysis, Routine w reflex microscopic  Result Value Ref Range   Color, Urine YELLOW YELLOW   APPearance CLEAR CLEAR   Specific Gravity, Urine 1.020 1.001 - 1.03   pH 7.0 5.0 - 8.0   Glucose, UA NEGATIVE NEGATIVE   Bilirubin Urine NEGATIVE NEGATIVE   Ketones, ur NEGATIVE NEGATIVE   Hgb urine dipstick NEGATIVE NEGATIVE   Protein, ur NEGATIVE NEGATIVE   Nitrite NEGATIVE NEGATIVE   Leukocytes,Ua 2+ (A) NEGATIVE   WBC, UA 0-5 0 - 5 /HPF   RBC / HPF NONE SEEN 0 - 2 /HPF   Squamous Epithelial / LPF 0-5 < OR = 5 /HPF   Bacteria, UA NONE SEEN NONE SEEN /HPF   AMORPHOUS SEDIMENT FEW NONE OR FE /HPF  Microscopic Message  Result Value Ref Range   Note          Assessment & Plan:      ICD-10-CM   1. Upper respiratory tract infection, unspecified type J06.9    mild uri sx  2. Acute otalgia, right H92.01    referred vs eustachian tube dysfunction  3. Bilateral low back pain without sciatica, unspecified chronicity  M54.5    not reproducible on exam, normal ROM, strength, sensation, gait, no CVA tenderness  4. Dark urine R82.998 Urinalysis, Routine w reflex microscopic    Microscopic Message    CANCELED: Urine Culture   UA unremarkable, no other sx, push fluids        Delsa Grana, PA-C 08/24/18 3:09 PM

## 2019-04-13 ENCOUNTER — Other Ambulatory Visit: Payer: Self-pay | Admitting: Obstetrics and Gynecology

## 2019-04-13 DIAGNOSIS — Z9189 Other specified personal risk factors, not elsewhere classified: Secondary | ICD-10-CM

## 2019-08-22 IMAGING — CT CT MAXILLOFACIAL W/O CM
3 of 5 series · 15 of 47 positions shown, 18 images · non-contrast
Comparison: None.

CLINICAL DATA: Laceration, tripped and fell hit left side of nose

EXAM:
CT MAXILLOFACIAL WITHOUT CONTRAST
TECHNIQUE: Multidetector CT imaging of the maxillofacial structures was
performed. Multiplanar CT image reconstructions were also generated.

[Series 2: max soft · axial · 0.34mm/px · z∈[-183,-47]mm · 12 of 80 slices shown, 15 images]
[im 6/80  brain]
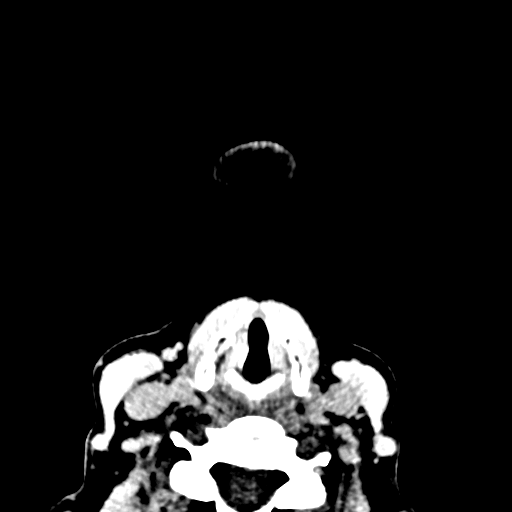
[im 6/80  bone]
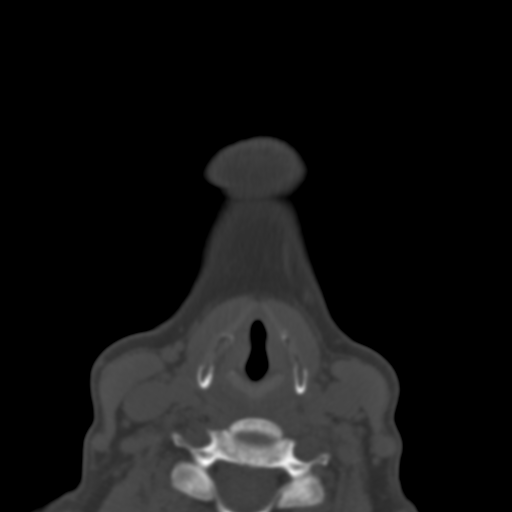
[im 11/80  bone]
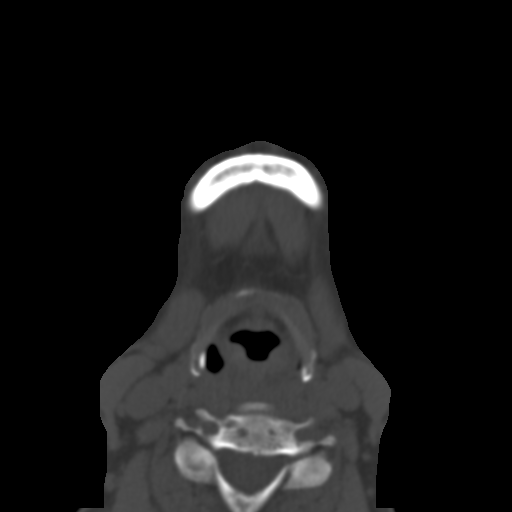
[im 17/80  bone]
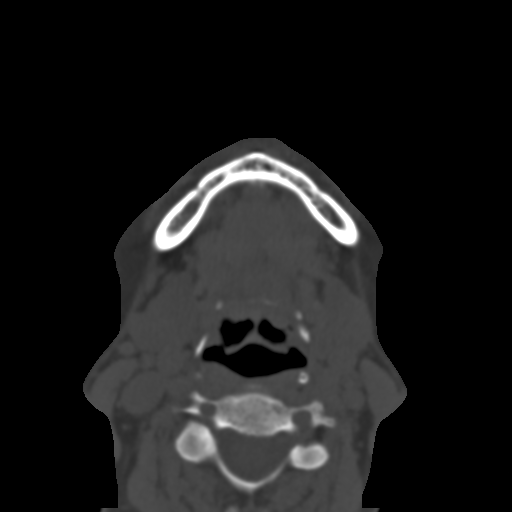
[im 25/80  bone]
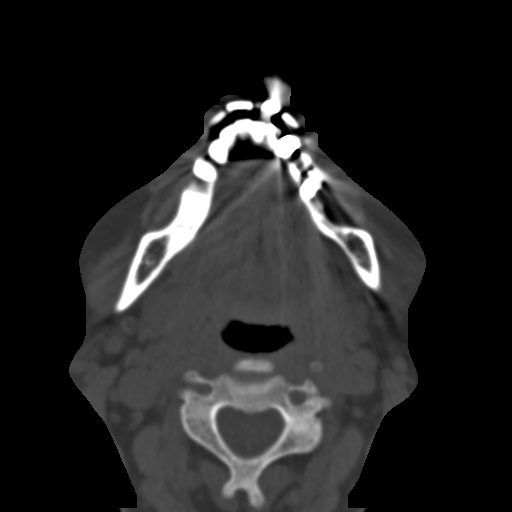
[im 30/80  brain]
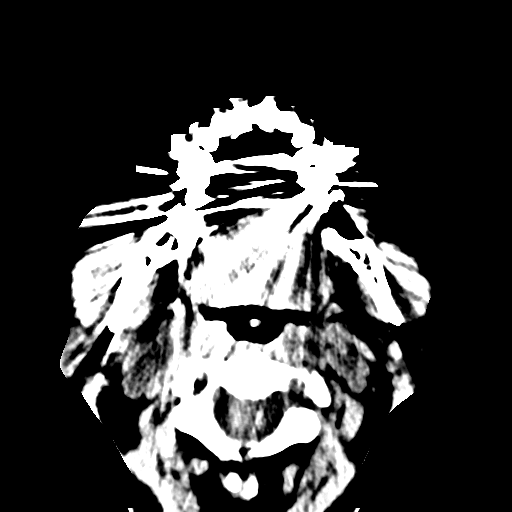
[im 30/80  bone]
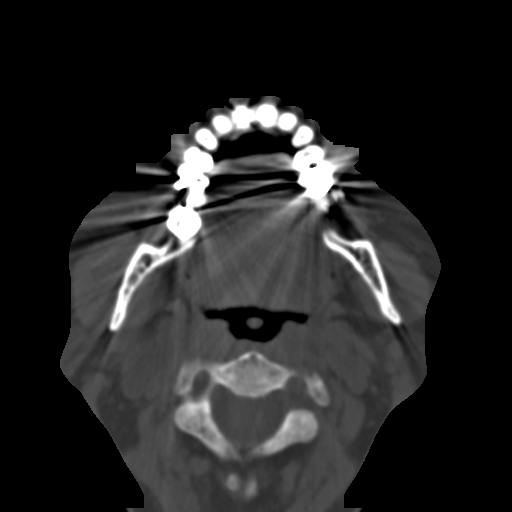
[im 36/80  bone]
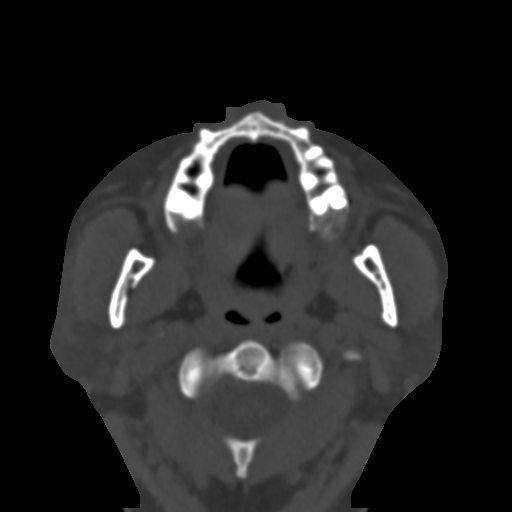
[im 44/80  bone]
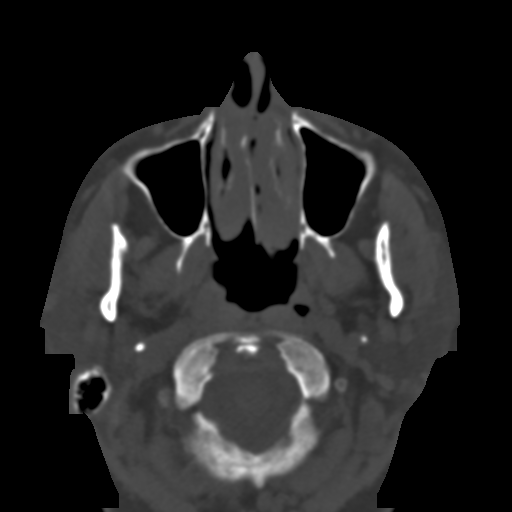
[im 50/80  bone]
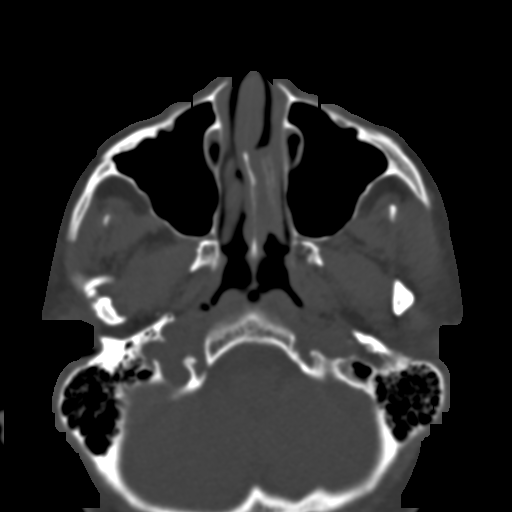
[im 55/80  brain]
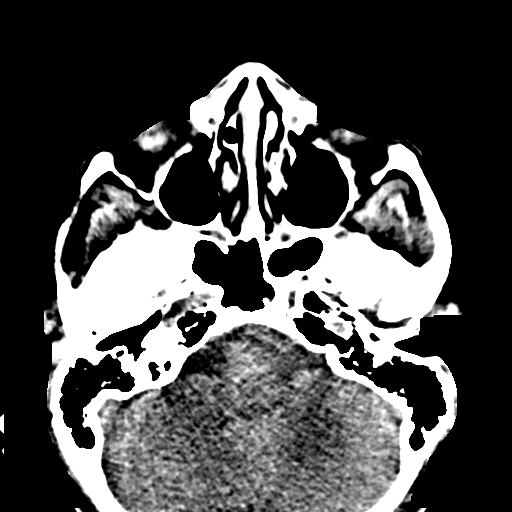
[im 55/80  bone]
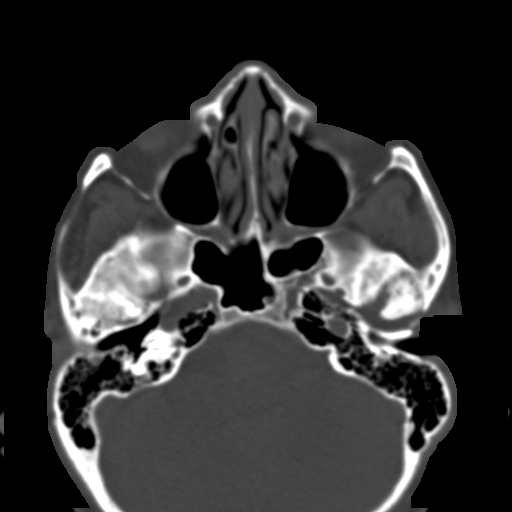
[im 63/80  bone]
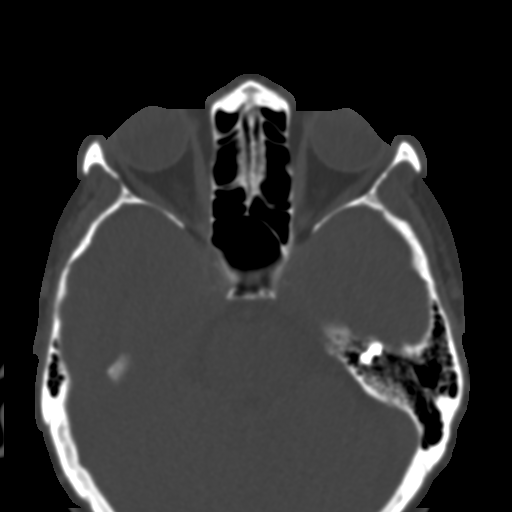
[im 69/80  bone]
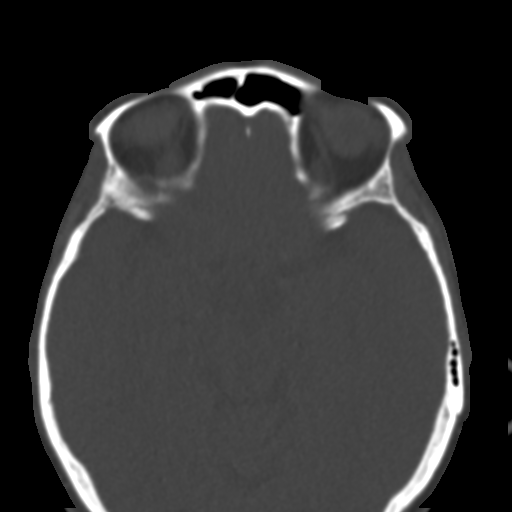
[im 74/80  bone]
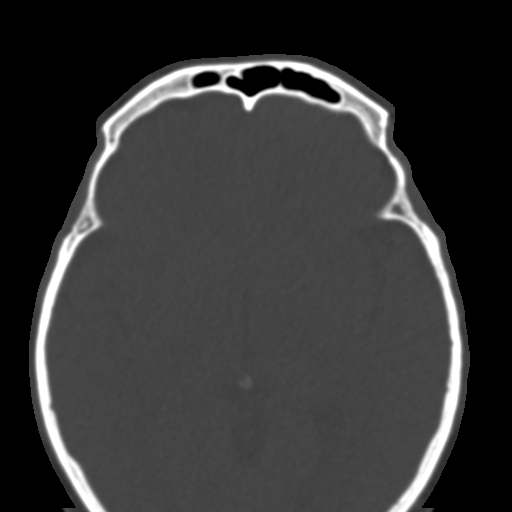

[Series 8: coronal bone · coronal · 0.35mm/px · 2 of 72 slices shown]
[im 24/72  bone]
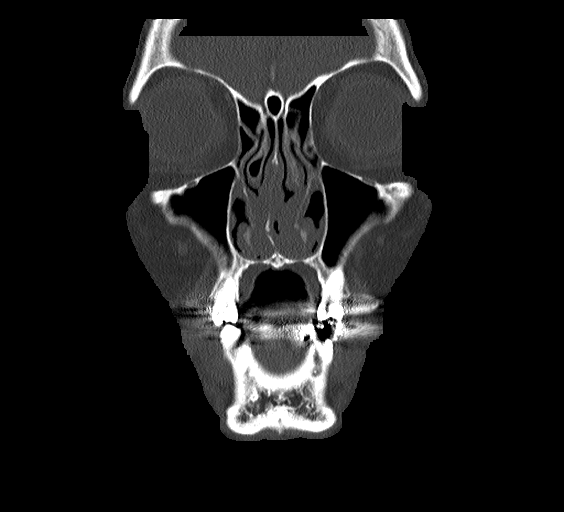
[im 48/72  bone]
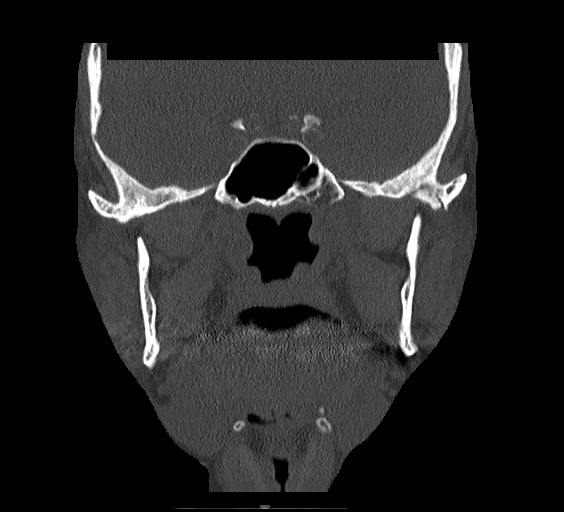

[Series 9: sagittal bone · sagittal · 0.30mm/px · 1 of 69 slices shown]
[im 35/69  bone]
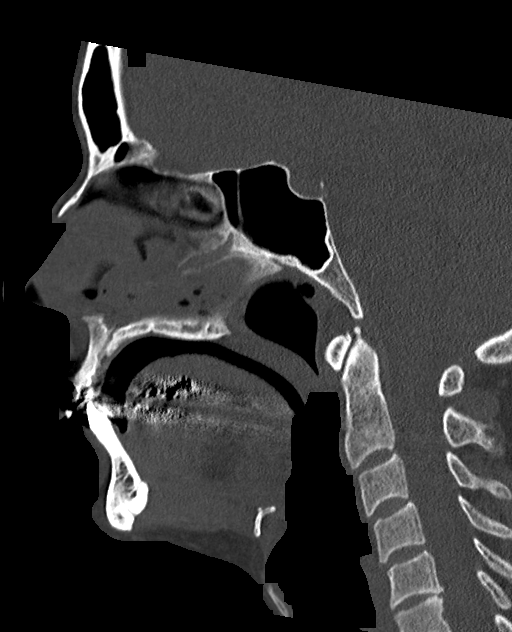

[15 of 47 positions shown; findings below may reference images not displayed]

FINDINGS: Osseous: TMJ degenerative changes. No mandibular fracture. Pterygoid
plates and zygomatic arches are intact. No acute nasal bone
fracture.

Orbits: Negative. No traumatic or inflammatory finding.

Sinuses: No fluid levels.  No sinus wall fracture.

Soft tissues: Mild soft tissue swelling over the nasal region

Limited intracranial: No significant or unexpected finding.
IMPRESSION: No acute displaced facial bone fracture.

## 2020-02-04 ENCOUNTER — Ambulatory Visit: Payer: BC Managed Care – PPO | Admitting: Nurse Practitioner

## 2020-02-04 ENCOUNTER — Encounter: Payer: Self-pay | Admitting: Family Medicine

## 2020-02-04 ENCOUNTER — Other Ambulatory Visit: Payer: Self-pay

## 2020-02-04 VITALS — BP 140/92 | HR 57 | Temp 97.9°F | Resp 18 | Wt 154.0 lb

## 2020-02-04 DIAGNOSIS — Z1152 Encounter for screening for COVID-19: Secondary | ICD-10-CM | POA: Diagnosis not present

## 2020-02-04 DIAGNOSIS — J310 Chronic rhinitis: Secondary | ICD-10-CM

## 2020-02-04 DIAGNOSIS — H6691 Otitis media, unspecified, right ear: Secondary | ICD-10-CM | POA: Diagnosis not present

## 2020-02-04 MED ORDER — AMOXICILLIN 875 MG PO TABS: 875.0000 mg | ORAL_TABLET | Freq: Two times a day (BID) | ORAL | 0 refills | Status: DC

## 2020-02-04 MED ORDER — FLUTICASONE PROPIONATE 50 MCG/ACT NA SUSP: 2.0000 | Freq: Every day | NASAL | 6 refills | Status: DC

## 2020-02-04 NOTE — Progress Notes (Signed)
Established Patient Office Visit  Subjective:  Patient ID: Rebecca Herman, female    DOB: 04/04/1956  Age: 64 y.o. MRN: [id number]  CC:  Chief Complaint  Patient presents with  . Ear Pain    R ear, x1 week  . Edema    R foot, just started today    HPI Rebecca Herman is a 64 year old female presenting to the clinic with right ear pain. No recent swimming. She report sxs started Friday. No fever, chills, general body aches, loss of taste, smell, cough or h/a no sick contacts with similar sxs, no covid contacts. No nasal drainage or sneezing. No txs tried. Her friends and work want her COVID tested, she is asking for COVID testing.   No cp, ct, gu/gi sxs, pain, sob, falls, edema.  Past Medical History:  Diagnosis Date  . Osteoporosis   . Varicose veins of bilateral lower extremities with pain     Past Surgical History:  Procedure Laterality Date  . TONSILLECTOMY    . TUBAL LIGATION    . varicose veins      Family History  Problem Relation Age of Onset  . Cancer Mother        breast  . Diabetes Mother   . Hypertension Mother   . Cancer Father     Social History   Socioeconomic History  . Marital status: Married    Spouse name: Not on file  . Number of children: Not on file  . Years of education: Not on file  . Highest education level: Not on file  Occupational History  . Not on file  Tobacco Use  . Smoking status: Never Smoker  . Smokeless tobacco: Never Used  Vaping Use  . Vaping Use: Never used  Substance and Sexual Activity  . Alcohol use: No  . Drug use: No  . Sexual activity: Not on file  Other Topics Concern  . Not on file  Social History Narrative  . Not on file   Social Determinants of Health   Financial Resource Strain:   . Difficulty of Paying Living Expenses:   Food Insecurity:   . Worried About Charity fundraiser in the Last Year:   . Arboriculturist in the Last Year:   Transportation Needs:   . Film/video editor (Medical):   Marland Kitchen Lack  of Transportation (Non-Medical):   Physical Activity:   . Days of Exercise per Week:   . Minutes of Exercise per Session:   Stress:   . Feeling of Stress :   Social Connections:   . Frequency of Communication with Friends and Family:   . Frequency of Social Gatherings with Friends and Family:   . Attends Religious Services:   . Active Member of Clubs or Organizations:   . Attends Archivist Meetings:   Marland Kitchen Marital Status:   Intimate Partner Violence:   . Fear of Current or Ex-Partner:   . Emotionally Abused:   Marland Kitchen Physically Abused:   . Sexually Abused:     Outpatient Medications Prior to Visit  Medication Sig Dispense Refill  . Apple Cider Vinegar 300 MG TABS Take by mouth.    Marland Kitchen CINNAMON PO Take 1,000 mg by mouth daily.    Marland Kitchen ELDERBERRY PO Take by mouth.    . Multiple Vitamin (MULTIVITAMIN) tablet Take 1 tablet by mouth daily.    . TURMERIC PO Take by mouth.    . Calcium Carbonate-Vitamin D (CALCIUM + D PO)  Take 1 tablet by mouth 2 (two) times daily.     No facility-administered medications prior to visit.    No Known Allergies  ROS Review of Systems  All other systems reviewed and are negative.     Objective:    Physical Exam Vitals and nursing note reviewed.  Constitutional:      General: She is not in acute distress.    Appearance: Normal appearance. She is not ill-appearing.  HENT:     Head: Normocephalic and atraumatic.     Right Ear: Hearing, ear canal and external ear normal. Tympanic membrane is erythematous and bulging.     Left Ear: Hearing, tympanic membrane, ear canal and external ear normal.     Nose: Rhinorrhea present. No congestion.     Mouth/Throat:     Lips: Pink.     Mouth: Mucous membranes are moist.     Tongue: No lesions. Tongue does not deviate from midline.     Pharynx: Oropharynx is clear. Uvula midline.     Tonsils: No tonsillar exudate or tonsillar abscesses.  Cardiovascular:     Rate and Rhythm: Normal rate and regular  rhythm.     Pulses: Normal pulses.     Heart sounds: Normal heart sounds.  Pulmonary:     Effort: Pulmonary effort is normal.     Breath sounds: Normal breath sounds.  Musculoskeletal:        General: Normal range of motion.     Cervical back: Normal range of motion and neck supple.     Right lower leg: No edema.     Left lower leg: No edema.  Lymphadenopathy:     Head:     Right side of head: No submental, submandibular or tonsillar adenopathy.     Left side of head: No submental, submandibular or tonsillar adenopathy.     Cervical: No cervical adenopathy.  Skin:    General: Skin is warm and dry.     Coloration: Skin is not jaundiced or pale.  Neurological:     General: No focal deficit present.     Mental Status: She is alert. Mental status is at baseline.  Psychiatric:        Attention and Perception: Attention normal.        Mood and Affect: Mood normal.        Speech: Speech normal.        Behavior: Behavior normal. Behavior is cooperative.        Cognition and Memory: Cognition normal.     BP (!) 140/92 (BP Location: Left Arm, Patient Position: Sitting, Cuff Size: Normal)   Pulse (!) 57   Temp 97.9 F (36.6 C) (Temporal)   Resp 18   Wt 154 lb (69.9 kg)   SpO2 97%   BMI 26.85 kg/m  Wt Readings from Last 3 Encounters:  02/04/20 154 lb (69.9 kg)  08/24/18 149 lb 4 oz (67.7 kg)  10/27/17 143 lb (64.9 kg)     Health Maintenance Due  Topic Date Due  . Hepatitis C Screening  Never done  . HIV Screening  Never done  . TETANUS/TDAP  Never done  . MAMMOGRAM  Never done  . PAP SMEAR-Modifier  01/29/2018  . INFLUENZA VACCINE  01/20/2020    There are no preventive care reminders to display for this patient.  No results found for: TSH Lab Results  Component Value Date   WBC 7.3 12/30/2016   HGB 13.0 12/30/2016   HCT 39.3 12/30/2016  MCV 89.5 12/30/2016   PLT 202 12/30/2016   Lab Results  Component Value Date   NA 139 12/30/2016   K 3.8 12/30/2016   CO2  27 12/30/2016   GLUCOSE 88 12/30/2016   BUN 22 12/30/2016   CREATININE 0.90 12/30/2016   BILITOT 0.5 12/30/2016   ALKPHOS 93 12/30/2016   AST 18 12/30/2016   ALT 16 12/30/2016   PROT 6.6 12/30/2016   ALBUMIN 4.1 12/30/2016   CALCIUM 9.3 12/30/2016   No results found for: CHOL No results found for: HDL No results found for: LDLCALC No results found for: TRIG No results found for: CHOLHDL No results found for: HGBA1C    Assessment & Plan:   Problem List Items Addressed This Visit    None    Visit Diagnoses    Rhinitis, unspecified type    -  Primary   Relevant Medications   fluticasone (FLONASE) 50 MCG/ACT nasal spray   Encounter for screening for COVID-19       Relevant Orders   SARS-COV-2 RNA,(COVID-19) QUAL NAAT   Right otitis media, unspecified otitis media type       Relevant Medications   amoxicillin (AMOXIL) 875 MG tablet    You have an ear infection and rhinitis:  Use Flonase as directed and take antibiotic as directed for ear infection/ COVID test as you asked not for sxs. Get help right away if:  You have severe pain that is not controlled with medicine.  You have swelling, redness, or pain around your ear.  You have stiffness in your neck.  A part of your face is paralyzed.  The bone behind your ear (mastoid) is tender when you touch it.  You develop a severe headache   Meds ordered this encounter  Medications  . fluticasone (FLONASE) 50 MCG/ACT nasal spray    Sig: Place 2 sprays into both nostrils daily.    Dispense:  16 g    Refill:  6  . amoxicillin (AMOXIL) 875 MG tablet    Sig: Take 1 tablet (875 mg total) by mouth 2 (two) times daily.    Dispense:  20 tablet    Refill:  0    Follow-up: Return if symptoms worsen or fail to improve.    Annie Main, FNP

## 2020-02-05 LAB — SARS-COV-2 RNA,(COVID-19) QUALITATIVE NAAT: SARS CoV2 RNA: NOT DETECTED

## 2020-02-06 NOTE — Progress Notes (Signed)
Negative COVID if sxs persist consider retest. If someone in the household has tested positive assume quarantine for 14 days until sxs free. Treat sxs with OTC medication seek medical attn for new, non resolving or worsening sxs.

## 2020-02-14 ENCOUNTER — Ambulatory Visit: Payer: BC Managed Care – PPO | Admitting: Nurse Practitioner

## 2020-02-14 ENCOUNTER — Encounter: Payer: Self-pay | Admitting: Nurse Practitioner

## 2020-02-14 ENCOUNTER — Other Ambulatory Visit: Payer: Self-pay

## 2020-02-14 VITALS — BP 122/64 | HR 58 | Temp 98.0°F | Resp 16 | Ht 63.0 in | Wt 152.0 lb

## 2020-02-14 DIAGNOSIS — H6091 Unspecified otitis externa, right ear: Secondary | ICD-10-CM

## 2020-02-14 MED ORDER — OFLOXACIN 0.3 % OT SOLN: 10.0000 [drp] | Freq: Every day | OTIC | 0 refills | Status: AC

## 2020-02-14 NOTE — Progress Notes (Signed)
Established Patient Office Visit  Subjective:  Patient ID: Rebecca Herman, female    DOB: [birth year]  Age: 64 y.o. MRN: [id number]  CC:  Chief Complaint  Patient presents with  . R Ear Pain    x weeks- continued ear pain no drainage, no injury    HPI Rebecca Herman is a 64 year old female presenting to the clinic for continued right ear pain. She was treated for otitis media with oral Amoxicillin twice daily [id number]. She did take the medication as prescribed  Finishing her last dose today. Today she reports that the ear pain she was having has improved however the inner ear canal and external ear feel tender. She has not used or tried other treatments. Denied flushing, swimming, placing objects or solutions in her ears. She denied hearing loss or change, no drainage, no fever, chills, nasal congestion or drainage, headache, general body aches, sinus pressure.   No cp, ct, gu/gi sxs, other pain, edema.   Time spent discussing tx plan and follow up pt verbalized understanding and desire to proceed.   Past Medical History:  Diagnosis Date  . Osteoporosis   . Varicose veins of bilateral lower extremities with pain     Past Surgical History:  Procedure Laterality Date  . TONSILLECTOMY    . TUBAL LIGATION    . varicose veins      Family History  Problem Relation Age of Onset  . Cancer Mother        breast  . Diabetes Mother   . Hypertension Mother   . Cancer Father     Social History   Socioeconomic History  . Marital status: Married    Spouse name: Not on file  . Number of children: Not on file  . Years of education: Not on file  . Highest education level: Not on file  Occupational History  . Not on file  Tobacco Use  . Smoking status: Never Smoker  . Smokeless tobacco: Never Used  Vaping Use  . Vaping Use: Never used  Substance and Sexual Activity  . Alcohol use: No  . Drug use: No  . Sexual activity: Not on file  Other Topics Concern  . Not on file    Social History Narrative  . Not on file   Social Determinants of Health   Financial Resource Strain:   . Difficulty of Paying Living Expenses: Not on file  Food Insecurity:   . Worried About Charity fundraiser in the Last Year: Not on file  . Ran Out of Food in the Last Year: Not on file  Transportation Needs:   . Lack of Transportation (Medical): Not on file  . Lack of Transportation (Non-Medical): Not on file  Physical Activity:   . Days of Exercise per Week: Not on file  . Minutes of Exercise per Session: Not on file  Stress:   . Feeling of Stress : Not on file  Social Connections:   . Frequency of Communication with Friends and Family: Not on file  . Frequency of Social Gatherings with Friends and Family: Not on file  . Attends Religious Services: Not on file  . Active Member of Clubs or Organizations: Not on file  . Attends Archivist Meetings: Not on file  . Marital Status: Not on file  Intimate Partner Violence:   . Fear of Current or Ex-Partner: Not on file  . Emotionally Abused: Not on file  . Physically Abused: Not on file  .  Sexually Abused: Not on file    Outpatient Medications Prior to Visit  Medication Sig Dispense Refill  . Apple Cider Vinegar 300 MG TABS Take by mouth.    Marland Kitchen CINNAMON PO Take 1,000 mg by mouth daily.    Marland Kitchen ELDERBERRY PO Take by mouth.    . fluticasone (FLONASE) 50 MCG/ACT nasal spray Place 2 sprays into both nostrils daily. 16 g 6  . Multiple Vitamin (MULTIVITAMIN) tablet Take 1 tablet by mouth daily.    . TURMERIC PO Take by mouth.    Marland Kitchen amoxicillin (AMOXIL) 875 MG tablet Take 1 tablet (875 mg total) by mouth 2 (two) times daily. 20 tablet 0   No facility-administered medications prior to visit.    No Known Allergies  ROS Review of Systems  All other systems reviewed and are negative.     Objective:    Physical Exam Vitals and nursing note reviewed.  Constitutional:      General: She is not in acute distress.     Appearance: Normal appearance. She is not ill-appearing.  HENT:     Head: Normocephalic.     Right Ear: Hearing and tympanic membrane normal. Swelling and tenderness present. There is no impacted cerumen.     Left Ear: Hearing, tympanic membrane, ear canal and external ear normal.  Eyes:     General: Lids are normal. Lids are everted, no foreign bodies appreciated.     Extraocular Movements: Extraocular movements intact.     Conjunctiva/sclera: Conjunctivae normal.     Pupils: Pupils are equal, round, and reactive to light.  Cardiovascular:     Rate and Rhythm: Normal rate.  Pulmonary:     Effort: Pulmonary effort is normal.  Skin:    General: Skin is warm and dry.     Coloration: Skin is not jaundiced or pale.  Neurological:     General: No focal deficit present.     Mental Status: She is alert and oriented to person, place, and time.  Psychiatric:        Mood and Affect: Mood normal.        Thought Content: Thought content normal.        Judgment: Judgment normal.     BP 122/64   Pulse (!) 58   Temp 98 F (36.7 C) (Temporal)   Resp 16   Ht 5\' 3"  (1.6 m)   Wt 152 lb (68.9 kg)   SpO2 98%   BMI 26.93 kg/m  Wt Readings from Last 3 Encounters:  02/14/20 152 lb (68.9 kg)  02/04/20 154 lb (69.9 kg)  08/24/18 149 lb 4 oz (67.7 kg)     Health Maintenance Due  Topic Date Due  . Hepatitis C Screening  Never done  . HIV Screening  Never done  . TETANUS/TDAP  Never done  . MAMMOGRAM  Never done  . PAP SMEAR-Modifier  01/29/2018  . INFLUENZA VACCINE  01/20/2020    There are no preventive care reminders to display for this patient.  No results found for: TSH Lab Results  Component Value Date   WBC 7.3 12/30/2016   HGB 13.0 12/30/2016   HCT 39.3 12/30/2016   MCV 89.5 12/30/2016   PLT 202 12/30/2016   Lab Results  Component Value Date   NA 139 12/30/2016   K 3.8 12/30/2016   CO2 27 12/30/2016   GLUCOSE 88 12/30/2016   BUN 22 12/30/2016   CREATININE 0.90  12/30/2016   BILITOT 0.5 12/30/2016   ALKPHOS 93  12/30/2016   AST 18 12/30/2016   ALT 16 12/30/2016   PROT 6.6 12/30/2016   ALBUMIN 4.1 12/30/2016   CALCIUM 9.3 12/30/2016   No results found for: CHOL No results found for: HDL No results found for: LDLCALC No results found for: TRIG No results found for: CHOLHDL No results found for: HGBA1C    Assessment & Plan:   Problem List Items Addressed This Visit    None    Visit Diagnoses    Otitis externa of right ear, unspecified chronicity, unspecified type    -  Primary   Relevant Medications   ofloxacin (FLOXIN) 0.3 % OTIC solution      Meds ordered this encounter  Medications  . ofloxacin (FLOXIN) 0.3 % OTIC solution    Sig: Place 10 drops into the right ear daily for 7 days.    Dispense:  3.5 mL    Refill:  0    Follow-up: Return if symptoms worsen or fail to improve.    Annie Main, FNP

## 2020-03-04 ENCOUNTER — Telehealth: Payer: Self-pay | Admitting: Family Medicine

## 2020-03-04 ENCOUNTER — Other Ambulatory Visit: Payer: Self-pay

## 2020-03-04 DIAGNOSIS — H6091 Unspecified otitis externa, right ear: Secondary | ICD-10-CM

## 2020-03-04 NOTE — Telephone Encounter (Signed)
CB# [phone] Pt need a referral to see a  ENT

## 2020-03-04 NOTE — Telephone Encounter (Signed)
Sending referral to [facility] pending an appt

## 2020-04-07 DIAGNOSIS — M26621 Arthralgia of right temporomandibular joint: Secondary | ICD-10-CM | POA: Insufficient documentation

## 2020-04-07 DIAGNOSIS — H9201 Otalgia, right ear: Secondary | ICD-10-CM | POA: Insufficient documentation

## 2020-04-11 ENCOUNTER — Other Ambulatory Visit: Payer: Self-pay | Admitting: Obstetrics and Gynecology

## 2020-04-11 DIAGNOSIS — Z9189 Other specified personal risk factors, not elsewhere classified: Secondary | ICD-10-CM

## 2020-06-19 ENCOUNTER — Other Ambulatory Visit: Payer: Self-pay

## 2020-06-19 ENCOUNTER — Ambulatory Visit: Payer: BC Managed Care – PPO

## 2020-06-19 ENCOUNTER — Telehealth: Payer: Self-pay | Admitting: Family Medicine

## 2020-06-19 DIAGNOSIS — Z1152 Encounter for screening for COVID-19: Secondary | ICD-10-CM

## 2020-06-19 NOTE — Telephone Encounter (Signed)
daughter tested postive covid  06/16/2020 would like to be tested # [phone]

## 2020-06-19 NOTE — Telephone Encounter (Signed)
Spoke with pt, she is not having any sx, just wants to make sure she is not + before she goes to visit her mother

## 2020-06-21 LAB — SARS-COV-2 RNA,(COVID-19) QUALITATIVE NAAT: SARS CoV2 RNA: NOT DETECTED

## 2020-08-11 LAB — HM COLONOSCOPY

## 2021-05-05 DIAGNOSIS — M810 Age-related osteoporosis without current pathological fracture: Secondary | ICD-10-CM | POA: Insufficient documentation

## 2021-07-13 ENCOUNTER — Ambulatory Visit: Payer: BC Managed Care – PPO | Admitting: Family Medicine

## 2021-07-13 ENCOUNTER — Encounter: Payer: Self-pay | Admitting: Family Medicine

## 2021-07-13 ENCOUNTER — Other Ambulatory Visit: Payer: Self-pay

## 2021-07-13 VITALS — BP 142/82 | HR 60 | Temp 97.6°F | Resp 18 | Ht 63.0 in | Wt 159.0 lb

## 2021-07-13 DIAGNOSIS — D1801 Hemangioma of skin and subcutaneous tissue: Secondary | ICD-10-CM

## 2021-07-13 DIAGNOSIS — R300 Dysuria: Secondary | ICD-10-CM

## 2021-07-13 LAB — URINALYSIS, ROUTINE W REFLEX MICROSCOPIC
Bacteria, UA: NONE SEEN /HPF
Bilirubin Urine: NEGATIVE
Glucose, UA: NEGATIVE
Hyaline Cast: NONE SEEN /LPF
Ketones, ur: NEGATIVE
Nitrite: NEGATIVE
Specific Gravity, Urine: 1.01 (ref 1.001–1.035)
pH: 6.5 (ref 5.0–8.0)

## 2021-07-13 LAB — MICROSCOPIC MESSAGE

## 2021-07-13 MED ORDER — SULFAMETHOXAZOLE-TRIMETHOPRIM 800-160 MG PO TABS: 1.0000 | ORAL_TABLET | Freq: Two times a day (BID) | ORAL | 0 refills | Status: DC

## 2021-07-13 NOTE — Progress Notes (Signed)
° °  Subjective:    Patient ID: Rebecca Herman, female    DOB: [birth year], 67 y.o.   MRN: [id number]  HPI  Patient presents today with 2 concerns.  First he has a purple domelike mole on her left leg/anterior thigh.  It is about 3 to 4 mm in diameter.  It is a purple smooth dome.  With pressure, it is blanchable.  It appears to be a hemangioma.  Second concern is dysuria and cloudy urine for 2 days.  Urinalysis shows +3 blood, negative nitrates, trace leukocyte esterase Past Medical History:  Diagnosis Date   Osteoporosis    Varicose veins of bilateral lower extremities with pain    .psh Current Outpatient Medications on File Prior to Visit  Medication Sig Dispense Refill   Apple Cider Vinegar 300 MG TABS Take by mouth.     CINNAMON PO Take 1,000 mg by mouth daily.     ELDERBERRY PO Take by mouth.     Multiple Vitamin (MULTIVITAMIN) tablet Take 1 tablet by mouth daily.     TURMERIC PO Take by mouth.     rosuvastatin (CRESTOR) 5 MG tablet Take 5 mg by mouth daily. (Patient not taking: Reported on 07/13/2021)     No current facility-administered medications on file prior to visit.   Marland Kitchenall Social History   Socioeconomic History   Marital status: Married    Spouse name: Not on file   Number of children: Not on file   Years of education: Not on file   Highest education level: Not on file  Occupational History   Not on file  Tobacco Use   Smoking status: Never   Smokeless tobacco: Never  Vaping Use   Vaping Use: Never used  Substance and Sexual Activity   Alcohol use: No   Drug use: No   Sexual activity: Not on file  Other Topics Concern   Not on file  Social History Narrative   Not on file   Social Determinants of Health   Financial Resource Strain: Not on file  Food Insecurity: Not on file  Transportation Needs: Not on file  Physical Activity: Not on file  Stress: Not on file  Social Connections: Not on file  Intimate Partner Violence: Not on file     Review of  Systems  All other systems reviewed and are negative.     Objective:   Physical Exam Constitutional:      Appearance: Normal appearance.  Cardiovascular:     Rate and Rhythm: Normal rate and regular rhythm.     Pulses: Normal pulses.     Heart sounds: Normal heart sounds.  Musculoskeletal:       Legs:  Neurological:     Mental Status: She is alert.          Assessment & Plan:   Dysuria - Plan: Urinalysis, Routine w reflex microscopic  Hemangioma of skin I believe the lesion on the anterior left thigh is a hemangioma.  I reassured her that this is not a mole and does not require a biopsy.  Urinalysis suggest urinary tract infection.  I will treat this with Bactrim double strength tablets twice daily for 5 days

## 2021-07-20 ENCOUNTER — Telehealth: Payer: Self-pay

## 2021-07-20 DIAGNOSIS — R300 Dysuria: Secondary | ICD-10-CM

## 2021-07-20 NOTE — Telephone Encounter (Signed)
[facility] to advise  Orders placed for UA and culture  Pt needs lab appt

## 2021-07-20 NOTE — Telephone Encounter (Signed)
Pt was seen in office last week and states that she has finished her antibiotics and still having some issues. Pt wanted to know if she should come into office and give another urine sample. Please advise  Cb#: [phone]

## 2021-07-21 ENCOUNTER — Other Ambulatory Visit: Payer: Self-pay

## 2021-07-21 DIAGNOSIS — R300 Dysuria: Secondary | ICD-10-CM

## 2021-07-21 NOTE — Telephone Encounter (Signed)
Pt scheduled for lab visit today.

## 2021-07-22 ENCOUNTER — Telehealth: Payer: Self-pay

## 2021-07-22 LAB — URINE CULTURE
MICRO NUMBER:: 12943276
SPECIMEN QUALITY:: ADEQUATE

## 2021-07-22 LAB — URINALYSIS, ROUTINE W REFLEX MICROSCOPIC
Bacteria, UA: NONE SEEN /HPF
Bilirubin Urine: NEGATIVE
Glucose, UA: NEGATIVE
Ketones, ur: NEGATIVE
Leukocytes,Ua: NEGATIVE
Nitrite: NEGATIVE
Protein, ur: NEGATIVE
Specific Gravity, Urine: 1.012 (ref 1.001–1.035)
Squamous Epithelial / LPF: NONE SEEN /HPF (ref <=5)
WBC, UA: NONE SEEN /HPF (ref 0–5)
pH: 7 (ref 5.0–8.0)

## 2021-07-22 LAB — MICROSCOPIC MESSAGE

## 2021-07-22 NOTE — Telephone Encounter (Signed)
Pt called in not able to access her MyChart, and just wanted to know the results of her urine test. Please call pt at earliest convenience please.   Cb#:[phone]

## 2021-07-22 NOTE — Telephone Encounter (Signed)
Spoke with pt and advised UA has shown some improvement but we are waiting on Dr Samella Parr response as well as culture results. Pt voiced understanding. Nothing further needed at this time.

## 2021-07-27 ENCOUNTER — Telehealth: Payer: Self-pay | Admitting: Family Medicine

## 2021-07-27 NOTE — Telephone Encounter (Signed)
LMTRC

## 2021-07-27 NOTE — Telephone Encounter (Signed)
Patient would like to talk with a nurse about her results. She states she did see them on [place] but doesn't understand the results  Cb#:[phone]

## 2021-07-27 NOTE — Telephone Encounter (Signed)
Spoke with pt and advised of lab results. Nothing further needed at this time.  

## 2021-07-30 ENCOUNTER — Telehealth: Payer: Self-pay | Admitting: Family Medicine

## 2021-07-30 NOTE — Telephone Encounter (Signed)
Patient received bills in the mail from [place] for labs received 07/21/2021; states insurance wasn't billed.  Bill numbers are as follows: - for bill dated 07/24/2021, bill # is [id number] - for bill dated 07/23/2021, bill # is [id number]  Please advise at [phone].

## 2021-08-05 ENCOUNTER — Telehealth: Payer: Self-pay | Admitting: Family Medicine

## 2021-08-05 ENCOUNTER — Other Ambulatory Visit: Payer: Self-pay | Admitting: Family Medicine

## 2021-08-05 MED ORDER — ALPRAZOLAM 0.5 MG PO TABS: 0.5000 mg | ORAL_TABLET | Freq: Three times a day (TID) | ORAL | 0 refills | Status: DC | PRN

## 2021-08-05 NOTE — Telephone Encounter (Signed)
Pt advised. Nothing further needed at this time.  

## 2021-08-05 NOTE — Telephone Encounter (Signed)
Patient called to request rx from provider to calm nerves asap; states she found her brother in law deceased and is having difficulty dealing with it.   Please advise at [phone].

## 2021-08-26 NOTE — Telephone Encounter (Signed)
I have sent request to Jocelyn Lamer  at [place] to address ?

## 2021-09-08 ENCOUNTER — Telehealth: Payer: Self-pay | Admitting: Family Medicine

## 2021-09-08 NOTE — Telephone Encounter (Signed)
Patient called to follow up on two bills received from [place] for labs for  ?$94.50, DOS 07/21/21 ?$47.00 DOS 07/13/21 ? ?[facility] told patient they didn't have any insurance information on file but insurance info is in the system.  ? ?Please advise at [phone].  ?

## 2021-09-24 NOTE — Telephone Encounter (Signed)
I have sent a copy of her BCBS insurance card to [place] at [place]. They will submit claims to [facility]. ?I have also left vm for patient so I can advise her. ?

## 2021-09-28 NOTE — Telephone Encounter (Signed)
Outbound call placed to patient to return her call. Patient advised of status and is aware the insurance information will be sent to [facility].  ?

## 2021-12-29 ENCOUNTER — Ambulatory Visit (INDEPENDENT_AMBULATORY_CARE_PROVIDER_SITE_OTHER): Payer: BC Managed Care – PPO | Admitting: Family Medicine

## 2021-12-29 VITALS — BP 132/90 | HR 115 | Temp 97.7°F | Ht 63.0 in | Wt 164.0 lb

## 2021-12-29 DIAGNOSIS — J329 Chronic sinusitis, unspecified: Secondary | ICD-10-CM | POA: Diagnosis not present

## 2021-12-29 DIAGNOSIS — J310 Chronic rhinitis: Secondary | ICD-10-CM

## 2021-12-29 MED ORDER — AMOXICILLIN-POT CLAVULANATE 875-125 MG PO TABS: 1.0000 | ORAL_TABLET | Freq: Two times a day (BID) | ORAL | 0 refills | Status: DC

## 2021-12-29 MED ORDER — PREDNISONE 20 MG PO TABS: ORAL_TABLET | ORAL | 0 refills | Status: DC

## 2021-12-29 NOTE — Progress Notes (Signed)
Subjective:    Patient ID: Rebecca Herman, female    DOB: 1956-03-14, 66 y.o.   MRN: [id number]  HPI 7 days ago, the patient traveled to [place] with her family.  She developed a head cold.  However this is progressed to the point that she is now having pain in both temples.  Her teeth hurt.  She has constant rhinorrhea and postnasal drip.  The postnasal drip is causing a cough.  She has a daily headache.  She believes she has developed a sinus infection.  She went to an urgent care and started amoxicillin last week but it has not improved.  She is been taking Flonase without relief.  She denies any chest pain or shortness of breath. Past Medical History:  Diagnosis Date   Osteoporosis    Varicose veins of bilateral lower extremities with pain    Past Surgical History:  Procedure Laterality Date   TONSILLECTOMY     TUBAL LIGATION     varicose veins      Current Outpatient Medications on File Prior to Visit  Medication Sig Dispense Refill   Apple Cider Vinegar 300 MG TABS Take by mouth.     CINNAMON PO Take 1,000 mg by mouth daily.     Multiple Vitamin (MULTIVITAMIN) tablet Take 1 tablet by mouth daily.     rosuvastatin (CRESTOR) 5 MG tablet Take 5 mg by mouth daily.     TURMERIC PO Take by mouth.     ALPRAZolam (XANAX) 0.5 MG tablet Take 1 tablet (0.5 mg total) by mouth 3 (three) times daily as needed. (Patient not taking: Reported on 12/29/2021) 30 tablet 0   ELDERBERRY PO Take by mouth. (Patient not taking: Reported on 12/29/2021)     sulfamethoxazole-trimethoprim (BACTRIM DS) 800-160 MG tablet Take 1 tablet by mouth 2 (two) times daily. (Patient not taking: Reported on 12/29/2021) 10 tablet 0   No current facility-administered medications on file prior to visit.   Marland Kitchenall Social History   Socioeconomic History   Marital status: Married    Spouse name: Not on file   Number of children: Not on file   Years of education: Not on file   Highest education level: Not on file   Occupational History   Not on file  Tobacco Use   Smoking status: Never   Smokeless tobacco: Never  Vaping Use   Vaping Use: Never used  Substance and Sexual Activity   Alcohol use: No   Drug use: No   Sexual activity: Not on file  Other Topics Concern   Not on file  Social History Narrative   Not on file   Social Determinants of Health   Financial Resource Strain: Not on file  Food Insecurity: Not on file  Transportation Needs: Not on file  Physical Activity: Not on file  Stress: Not on file  Social Connections: Not on file  Intimate Partner Violence: Not on file     Review of Systems  All other systems reviewed and are negative.      Objective:   Physical Exam Constitutional:      General: She is not in acute distress.    Appearance: Normal appearance. She is not ill-appearing or toxic-appearing.  HENT:     Right Ear: Tympanic membrane and ear canal normal.     Left Ear: Tympanic membrane and ear canal normal.     Nose: Congestion and rhinorrhea present.     Mouth/Throat:     Pharynx: No oropharyngeal  exudate or posterior oropharyngeal erythema.  Eyes:     Conjunctiva/sclera: Conjunctivae normal.  Cardiovascular:     Rate and Rhythm: Normal rate and regular rhythm.     Pulses: Normal pulses.     Heart sounds: Normal heart sounds.  Musculoskeletal:     Cervical back: Normal range of motion.  Lymphadenopathy:     Cervical: No cervical adenopathy.  Neurological:     Mental Status: She is alert.           Assessment & Plan:   Rhinosinusitis Believe the patient has a sinus infection.  Change her antibiotic to Augmentin 875 mg twice daily for 10 days and add a prednisone taper pack due to the significant level of congestion and pressure in her sinuses.  Recheck if no better next week.

## 2022-01-08 ENCOUNTER — Telehealth: Payer: Self-pay

## 2022-01-08 ENCOUNTER — Other Ambulatory Visit: Payer: Self-pay | Admitting: Family Medicine

## 2022-01-08 MED ORDER — FLUCONAZOLE 150 MG PO TABS: 150.0000 mg | ORAL_TABLET | Freq: Once | ORAL | 0 refills | Status: AC

## 2022-01-08 NOTE — Telephone Encounter (Signed)
Pt called in stating that the antibiotic that she was prescribed recently has caused a yeast infection. Pt is asking if something could be called into pharmacy for this. Please advise.  Cb#: [phone]

## 2022-01-11 ENCOUNTER — Ambulatory Visit: Payer: BC Managed Care – PPO | Admitting: Family Medicine

## 2022-05-19 ENCOUNTER — Encounter: Payer: Self-pay | Admitting: Family Medicine

## 2022-05-19 ENCOUNTER — Ambulatory Visit: Payer: BC Managed Care – PPO | Admitting: Family Medicine

## 2022-05-19 VITALS — BP 130/70 | HR 58 | Temp 98.4°F | Ht 63.0 in | Wt 162.0 lb

## 2022-05-19 DIAGNOSIS — J329 Chronic sinusitis, unspecified: Secondary | ICD-10-CM | POA: Diagnosis not present

## 2022-05-19 DIAGNOSIS — R051 Acute cough: Secondary | ICD-10-CM | POA: Diagnosis not present

## 2022-05-19 NOTE — Progress Notes (Signed)
Acute Office Visit  Subjective:     Patient ID: Rebecca Herman, female    DOB: December 17, 1955, 66 y.o.   MRN: [id number]  Chief Complaint  Patient presents with   Follow-up    possible sinus infection, started on Sunday. No fever, bad cough, pt said her teeth hurts whenever she has a headache    HPI Patient is in today for mucopurulent cough, headache, sore throat, teeth pain since Sunday Denies fever, malaise, fatigue, shortness of breath, wheezing, chest pain Has tried Dayquil, Tylenol, prescription cough syrup with no improvement.  Review of Systems  Reason unable to perform ROS: see HPI.  All other systems reviewed and are negative.  Past Medical History:  Diagnosis Date   Osteoporosis    Varicose veins of bilateral lower extremities with pain    Past Surgical History:  Procedure Laterality Date   TONSILLECTOMY     TUBAL LIGATION     varicose veins     Current Outpatient Medications on File Prior to Visit  Medication Sig Dispense Refill   Apple Cider Vinegar 300 MG TABS Take by mouth.     CINNAMON PO Take 1,000 mg by mouth daily.     ELDERBERRY PO Take by mouth.     Multiple Vitamin (MULTIVITAMIN) tablet Take 1 tablet by mouth daily.     rosuvastatin (CRESTOR) 5 MG tablet Take 5 mg by mouth daily.     TURMERIC PO Take by mouth.     No current facility-administered medications on file prior to visit.   No Known Allergies      Objective:    BP 130/70   Pulse (!) 58   Temp 98.4 F (36.9 C) (Oral)   Ht '5\' 3"'$  (1.6 m)   Wt 162 lb (73.5 kg)   SpO2 95%   BMI 28.70 kg/m    Physical Exam Vitals and nursing note reviewed.  Constitutional:      Appearance: Normal appearance. She is normal weight.  HENT:     Head: Normocephalic and atraumatic.     Right Ear: There is impacted cerumen.     Left Ear: Tympanic membrane, ear canal and external ear normal.     Nose: Nose normal.     Mouth/Throat:     Mouth: Mucous membranes are moist.     Pharynx: Oropharynx is  clear.  Eyes:     Extraocular Movements: Extraocular movements intact.     Conjunctiva/sclera: Conjunctivae normal.     Pupils: Pupils are equal, round, and reactive to light.  Cardiovascular:     Rate and Rhythm: Regular rhythm. Bradycardia present.     Pulses: Normal pulses.     Heart sounds: Normal heart sounds.  Pulmonary:     Effort: Pulmonary effort is normal.     Breath sounds: Normal breath sounds.  Musculoskeletal:     Cervical back: Neck supple.  Lymphadenopathy:     Cervical: No cervical adenopathy.  Skin:    General: Skin is warm and dry.  Neurological:     General: No focal deficit present.     Mental Status: She is alert and oriented to person, place, and time. Mental status is at baseline.  Psychiatric:        Mood and Affect: Mood normal.        Behavior: Behavior normal.        Thought Content: Thought content normal.        Judgment: Judgment normal.     No results found for  any visits on 05/19/22.      Assessment & Plan:   Problem List Items Addressed This Visit       Respiratory   Rhinosinusitis - Primary    Reassured patient that symptoms and exam findings are most consistent with a viral upper respiratory infection and explained lack of efficacy of antibiotics against viruses.  Discussed expected course and features suggestive of secondary bacterial infection.  Continue supportive care. Increase fluid intake with water or electrolyte solution like pedialyte. Continue symptomatic management with Mucinex, Flonase, and prescribed cough syrup. Encouraged saline sinus flushes and/or neti with humidified air.        Other Visit Diagnoses     Acute cough       Relevant Orders   SARS-CoV-2 RNA, Influenza A/B, and RSV RNA, Qualitative NAAT       No orders of the defined types were placed in this encounter.   Return if symptoms worsen or fail to improve.  Rubie Maid, FNP

## 2022-05-19 NOTE — Assessment & Plan Note (Signed)
Reassured patient that symptoms and exam findings are most consistent with a viral upper respiratory infection and explained lack of efficacy of antibiotics against viruses.  Discussed expected course and features suggestive of secondary bacterial infection.  Continue supportive care. Increase fluid intake with water or electrolyte solution like pedialyte. Continue symptomatic management with Mucinex, Flonase, and prescribed cough syrup. Encouraged saline sinus flushes and/or neti with humidified air.

## 2022-05-19 NOTE — Patient Instructions (Signed)
Continue symptomatic management with Mucinex, Flonase, and prescribed cough syrup. Rest and drink plenty of fluids. Return to office if your symptoms worsen or include fever or persist for 10 days, you may need antibiotics.

## 2022-05-20 LAB — SARS-COV-2 RNA, INFLUENZA A/B, AND RSV RNA, QUALITATIVE NAAT
INFLUENZA A RNA: DETECTED — AB
INFLUENZA B RNA: NOT DETECTED
RSV RNA: NOT DETECTED
SARS COV2 RNA: NOT DETECTED

## 2022-08-02 ENCOUNTER — Other Ambulatory Visit: Payer: Self-pay | Admitting: Family Medicine

## 2022-08-02 DIAGNOSIS — Z1231 Encounter for screening mammogram for malignant neoplasm of breast: Secondary | ICD-10-CM

## 2022-09-23 ENCOUNTER — Encounter: Payer: Self-pay | Admitting: Family Medicine

## 2022-09-24 ENCOUNTER — Encounter: Payer: Self-pay | Admitting: Family Medicine

## 2022-09-24 ENCOUNTER — Ambulatory Visit: Payer: BC Managed Care – PPO | Admitting: Family Medicine

## 2022-09-24 VITALS — BP 128/72 | HR 74 | Temp 97.4°F | Ht 63.0 in | Wt 149.6 lb

## 2022-09-24 DIAGNOSIS — R300 Dysuria: Secondary | ICD-10-CM | POA: Diagnosis not present

## 2022-09-24 LAB — URINALYSIS, ROUTINE W REFLEX MICROSCOPIC
Bacteria, UA: NONE SEEN /HPF
Bilirubin Urine: NEGATIVE
Glucose, UA: NEGATIVE
Hyaline Cast: NONE SEEN /LPF
Ketones, ur: NEGATIVE
Nitrite: NEGATIVE
Protein, ur: NEGATIVE
Specific Gravity, Urine: 1.025 (ref 1.001–1.035)
pH: 6 (ref 5.0–8.0)

## 2022-09-24 LAB — MICROSCOPIC MESSAGE

## 2022-09-24 MED ORDER — CEPHALEXIN 500 MG PO CAPS
500.0000 mg | ORAL_CAPSULE | Freq: Three times a day (TID) | ORAL | 0 refills | Status: DC
Start: 2022-09-24 — End: 2023-03-09

## 2022-09-24 MED ORDER — FLUCONAZOLE 150 MG PO TABS: 150.0000 mg | ORAL_TABLET | Freq: Once | ORAL | 0 refills | Status: AC

## 2022-09-24 NOTE — Progress Notes (Signed)
Subjective:    Patient ID: Rebecca ShoreSandra Herman, female    DOB: 12/11/1955, 67 y.o.   MRN: [id number]  Dysuria    Patient reports feeling nauseated for the last few days.  She reports dysuria for the last few days.  She reports a dull low back pain unrelated to movement.  She does have some mild CVA tenderness left greater than right.  She is complaining of frequency and urgency and dysuria.  Urinalysis however is unremarkable.  There is trace blood and trace leukocyte Estrace but otherwise normal.  She also reports a dull headache and teeth hurting.  Last weekend she was at a wedding.  She also had to take her mom to the hospital.  Therefore she was around a lot of different people that may have been carrying a virus.  She denies any chest pain or cough or shortness of breath.  Her biggest concerns are her nausea, her dysuria, her low back pain, and the body aches Past Medical History:  Diagnosis Date   Osteoporosis    Varicose veins of bilateral lower extremities with pain    Past Surgical History:  Procedure Laterality Date   TONSILLECTOMY     TUBAL LIGATION     varicose veins      Current Outpatient Medications on File Prior to Visit  Medication Sig Dispense Refill   Apple Cider Vinegar 300 MG TABS Take by mouth.     CINNAMON PO Take 1,000 mg by mouth daily.     ELDERBERRY PO Take by mouth.     Multiple Vitamin (MULTIVITAMIN) tablet Take 1 tablet by mouth daily.     rosuvastatin (CRESTOR) 5 MG tablet Take 5 mg by mouth daily.     TURMERIC PO Take by mouth.     No current facility-administered medications on file prior to visit.   Marland Kitchen.all Social History   Socioeconomic History   Marital status: Married    Spouse name: Not on file   Number of children: Not on file   Years of education: Not on file   Highest education level: Not on file  Occupational History   Not on file  Tobacco Use   Smoking status: Never   Smokeless tobacco: Never  Vaping Use   Vaping Use: Never used   Substance and Sexual Activity   Alcohol use: No   Drug use: No   Sexual activity: Not on file  Other Topics Concern   Not on file  Social History Narrative   Not on file   Social Determinants of Health   Financial Resource Strain: Not on file  Food Insecurity: Not on file  Transportation Needs: Not on file  Physical Activity: Not on file  Stress: Not on file  Social Connections: Not on file  Intimate Partner Violence: Not on file     Review of Systems  Genitourinary:  Positive for dysuria.  All other systems reviewed and are negative.      Objective:   Physical Exam Constitutional:      General: She is not in acute distress.    Appearance: Normal appearance. She is not ill-appearing, toxic-appearing or diaphoretic.  HENT:     Right Ear: Tympanic membrane and ear canal normal.     Left Ear: Tympanic membrane and ear canal normal.     Nose: No congestion or rhinorrhea.     Mouth/Throat:     Mouth: Mucous membranes are moist.     Pharynx: No oropharyngeal exudate or posterior oropharyngeal erythema.  Eyes:     Conjunctiva/sclera: Conjunctivae normal.  Cardiovascular:     Rate and Rhythm: Normal rate and regular rhythm.     Pulses: Normal pulses.     Heart sounds: Normal heart sounds.  Pulmonary:     Effort: No respiratory distress.     Breath sounds: Normal breath sounds. No stridor. No wheezing, rhonchi or rales.  Chest:     Chest wall: No tenderness.  Abdominal:     General: Bowel sounds are normal. There is no distension.     Palpations: Abdomen is soft.     Tenderness: There is no abdominal tenderness. There is no guarding or rebound.  Neurological:     Mental Status: She is alert.           Assessment & Plan:   Dysuria Based on her history and the slightly abnormal urinalysis, I believe she may have a urinary tract infection.  I will give her Keflex 500 mg 3 times daily for 7 days.  However also recommended that she go home to take a home rapid  COVID test.  She has several flulike symptoms including body aches, headaches, diffuse myalgias.  I am concerned that she may have been exposed to the virus while at the hospital with wedding.

## 2023-03-09 ENCOUNTER — Ambulatory Visit: Payer: BC Managed Care – PPO | Admitting: Family Medicine

## 2023-03-09 ENCOUNTER — Encounter: Payer: Self-pay | Admitting: Family Medicine

## 2023-03-09 VITALS — BP 122/76 | HR 81 | Temp 98.0°F | Ht 63.0 in | Wt 157.2 lb

## 2023-03-09 DIAGNOSIS — M25579 Pain in unspecified ankle and joints of unspecified foot: Secondary | ICD-10-CM | POA: Diagnosis not present

## 2023-03-09 DIAGNOSIS — B356 Tinea cruris: Secondary | ICD-10-CM

## 2023-03-09 DIAGNOSIS — R200 Anesthesia of skin: Secondary | ICD-10-CM | POA: Diagnosis not present

## 2023-03-09 MED ORDER — CLOTRIMAZOLE 1 % EX CREA: 1.0000 | TOPICAL_CREAM | Freq: Two times a day (BID) | CUTANEOUS | 0 refills | Status: DC

## 2023-03-09 NOTE — Progress Notes (Signed)
Subjective:  HPI: Rebecca Herman is a 67 y.o. female presenting on 03/09/2023 for Rash (Red, inflamed, and itchy spot on arm for about 3 weeks. Also would like you to look at her fingers, she states they are getting lumpy. Also states that her left arm is going numb and her toes go numb and cramp. )   Rash   Patient is in today for a rash to her left wrist that has been present for 2 weeks. The rash is red, raised, and itchy. She denies spreading rash, fever, malaise, chills. Has tried nothing. She also reports cramping and numbness to her toes. It is worse in the evenings after standing all day. No color change or swelling. The symptoms are intermittent.  Review of Systems  Skin:  Positive for rash.  All other systems reviewed and are negative.   Relevant past medical history reviewed and updated as indicated.   Past Medical History:  Diagnosis Date   Osteoporosis    Varicose veins of bilateral lower extremities with pain      Past Surgical History:  Procedure Laterality Date   TONSILLECTOMY     TUBAL LIGATION     varicose veins      Allergies and medications reviewed and updated.   Current Outpatient Medications:    Apple Cider Vinegar 300 MG TABS, Take by mouth., Disp: , Rfl:    clotrimazole (LOTRIMIN) 1 % cream, Apply 1 Application topically 2 (two) times daily., Disp: 30 g, Rfl: 0   hydrochlorothiazide (MICROZIDE) 12.5 MG capsule, Take 10 mg by mouth daily., Disp: , Rfl:    Multiple Vitamin (MULTIVITAMIN) tablet, Take 1 tablet by mouth daily., Disp: , Rfl:    rosuvastatin (CRESTOR) 5 MG tablet, Take 5 mg by mouth daily., Disp: , Rfl:    TURMERIC PO, Take by mouth., Disp: , Rfl:    cephALEXin (KEFLEX) 500 MG capsule, Take 1 capsule (500 mg total) by mouth 3 (three) times daily. (Patient not taking: Reported on 03/09/2023), Disp: 21 capsule, Rfl: 0   CINNAMON PO, Take 1,000 mg by mouth daily. (Patient not taking: Reported on 03/09/2023), Disp: , Rfl:    ELDERBERRY PO,  Take by mouth. (Patient not taking: Reported on 03/09/2023), Disp: , Rfl:   No Known Allergies  Objective:   BP 122/76   Pulse 81   Temp 98 F (36.7 C) (Oral)   Ht 5\' 3"  (1.6 m)   Wt 157 lb 3.2 oz (71.3 kg)   SpO2 100%   BMI 27.85 kg/m      03/09/2023    3:58 PM 09/24/2022   10:05 AM 05/19/2022    9:59 AM  Vitals with BMI  Height 5\' 3"  5\' 3"  5\' 3"   Weight 157 lbs 3 oz 149 lbs 10 oz 162 lbs  BMI 27.85 26.51 28.7  Systolic 122 128 166  Diastolic 76 72 70  Pulse 81 74 58     Physical Exam Vitals and nursing note reviewed.  Constitutional:      Appearance: Normal appearance. She is normal weight.  HENT:     Head: Normocephalic and atraumatic.  Skin:    General: Skin is warm and dry.     Findings: Rash present.          Comments: Circular erythematous scaling patch approx 1.5cm in diameter.  Neurological:     General: No focal deficit present.     Mental Status: She is alert and oriented to person, place, and time. Mental status is  at baseline.     Sensory: Sensation is intact.     Motor: Motor function is intact.     Coordination: Coordination is intact.  Psychiatric:        Mood and Affect: Mood normal.        Behavior: Behavior normal.        Thought Content: Thought content normal.        Judgment: Judgment normal.     Assessment & Plan:  Tinea cruris Assessment & Plan: Appearance consistent with tinea rash, circular, erythematous scaling patch approx 1.5cm in size. Will start Clotrimazole BID. Return to office if symptoms persist or worsen.   Other orders -     Clotrimazole; Apply 1 Application topically 2 (two) times daily.  Dispense: 30 g; Refill: 0     Follow up plan: Return if symptoms worsen or fail to improve.  Park Meo, FNP

## 2023-03-09 NOTE — Assessment & Plan Note (Signed)
Appearance consistent with tinea rash, circular, erythematous scaling patch approx 1.5cm in size. Will start Clotrimazole BID. Return to office if symptoms persist or worsen.

## 2023-03-10 DIAGNOSIS — R200 Anesthesia of skin: Secondary | ICD-10-CM | POA: Insufficient documentation

## 2023-03-10 DIAGNOSIS — M25579 Pain in unspecified ankle and joints of unspecified foot: Secondary | ICD-10-CM | POA: Insufficient documentation

## 2023-03-10 NOTE — Assessment & Plan Note (Signed)
Neuro exam unremarkable, no sensory changes or weakness, no swelling, erythema, warmth. I believe this is positional nerve compression. seek medical care if this persists or worsens or includes symptoms like muscle weakness or swelling or for symptoms such as unilateral weakness, facial drooping, confusion, difficulty speaking.

## 2023-03-10 NOTE — Assessment & Plan Note (Signed)
No swelling, erythema, or tenderness on exam. I encouraged well fitting shoes and Ibuprofen PRN. Return to office if symptoms persist or worsen.

## 2023-04-07 ENCOUNTER — Encounter: Payer: Self-pay | Admitting: Family Medicine

## 2023-04-07 ENCOUNTER — Ambulatory Visit: Payer: BC Managed Care – PPO | Admitting: Family Medicine

## 2023-04-07 VITALS — BP 130/82 | HR 67 | Temp 98.8°F | Ht 63.0 in | Wt 157.6 lb

## 2023-04-07 DIAGNOSIS — J329 Chronic sinusitis, unspecified: Secondary | ICD-10-CM

## 2023-04-07 MED ORDER — HYDROCODONE BIT-HOMATROP MBR 5-1.5 MG/5ML PO SOLN: 5.0000 mL | Freq: Three times a day (TID) | ORAL | 0 refills | Status: DC | PRN

## 2023-04-07 MED ORDER — AMOXICILLIN 875 MG PO TABS: 875.0000 mg | ORAL_TABLET | Freq: Two times a day (BID) | ORAL | 0 refills | Status: AC

## 2023-04-07 NOTE — Progress Notes (Signed)
Subjective:    Patient ID: Rebecca Herman, female    DOB: 1956-01-12, 67 y.o.   MRN: [id number]  HPI 4 days ago, the patient developed head congestion and cough.  She states that now her head is throbbing and pounding.  She reports severe pain in her upper teeth bilaterally.  She has tenderness to palpation and percussion Over her maxillary sinuses.  She reports postnasal drip as well as a barking cough that is nonproductive. Past Medical History:  Diagnosis Date   Osteoporosis    Varicose veins of bilateral lower extremities with pain    Past Surgical History:  Procedure Laterality Date   TONSILLECTOMY     TUBAL LIGATION     varicose veins      Current Outpatient Medications on File Prior to Visit  Medication Sig Dispense Refill   Apple Cider Vinegar 300 MG TABS Take by mouth.     CINNAMON PO Take 1,000 mg by mouth daily. (Patient not taking: Reported on 03/09/2023)     clotrimazole (LOTRIMIN) 1 % cream Apply 1 Application topically 2 (two) times daily. 30 g 0   ELDERBERRY PO Take by mouth. (Patient not taking: Reported on 03/09/2023)     hydrochlorothiazide (MICROZIDE) 12.5 MG capsule Take 10 mg by mouth daily.     Multiple Vitamin (MULTIVITAMIN) tablet Take 1 tablet by mouth daily.     rosuvastatin (CRESTOR) 5 MG tablet Take 5 mg by mouth daily.     TURMERIC PO Take by mouth.     No current facility-administered medications on file prior to visit.   Marland Kitchenall Social History   Socioeconomic History   Marital status: Married    Spouse name: Not on file   Number of children: Not on file   Years of education: Not on file   Highest education level: Not on file  Occupational History   Not on file  Tobacco Use   Smoking status: Never   Smokeless tobacco: Never  Vaping Use   Vaping status: Never Used  Substance and Sexual Activity   Alcohol use: No   Drug use: No   Sexual activity: Not on file  Other Topics Concern   Not on file  Social History Narrative   Not on file    Social Determinants of Health   Financial Resource Strain: Not on file  Food Insecurity: Not on file  Transportation Needs: Not on file  Physical Activity: Not on file  Stress: Not on file  Social Connections: Not on file  Intimate Partner Violence: Not on file     Review of Systems  All other systems reviewed and are negative.      Objective:   Physical Exam Constitutional:      General: She is not in acute distress.    Appearance: Normal appearance. She is not ill-appearing or toxic-appearing.  HENT:     Right Ear: Tympanic membrane and ear canal normal.     Left Ear: Tympanic membrane and ear canal normal.     Nose: Congestion and rhinorrhea present.     Right Sinus: Maxillary sinus tenderness present. No frontal sinus tenderness.     Left Sinus: Maxillary sinus tenderness present. No frontal sinus tenderness.     Mouth/Throat:     Pharynx: No oropharyngeal exudate or posterior oropharyngeal erythema.  Eyes:     Conjunctiva/sclera: Conjunctivae normal.  Cardiovascular:     Rate and Rhythm: Normal rate and regular rhythm.     Pulses: Normal pulses.  Heart sounds: Normal heart sounds.  Musculoskeletal:     Cervical back: Normal range of motion.  Lymphadenopathy:     Cervical: No cervical adenopathy.  Neurological:     Mental Status: She is alert.           Assessment & Plan:   Rhinosinusitis Patient appears to have a sinus infection.  She had a negative COVID test earlier this week.  Begin amoxicillin 875 mg twice daily for 10 days and use Hycodan as needed for cough.

## 2023-04-21 ENCOUNTER — Telehealth: Payer: Self-pay

## 2023-04-21 NOTE — Telephone Encounter (Signed)
Pt called in stating that she has taken all antibiotics pcp has prescribed for her sinus condition. Pt stated that she is still not feeling any better and that her face has started to swell. Pt would like to know if pcp would send in another prescription of antibiotics or would she need to be seen again. Please advise  Cb#: [phone] ok to lvm

## 2023-04-22 ENCOUNTER — Other Ambulatory Visit: Payer: Self-pay | Admitting: Family Medicine

## 2023-04-22 ENCOUNTER — Telehealth: Payer: Self-pay

## 2023-04-22 MED ORDER — PREDNISONE 20 MG PO TABS: ORAL_TABLET | ORAL | 0 refills | Status: AC

## 2023-04-22 MED ORDER — AMOXICILLIN-POT CLAVULANATE 875-125 MG PO TABS: 1.0000 | ORAL_TABLET | Freq: Two times a day (BID) | ORAL | 0 refills | Status: DC

## 2023-04-22 MED ORDER — FLUCONAZOLE 150 MG PO TABS: 150.0000 mg | ORAL_TABLET | Freq: Once | ORAL | 0 refills | Status: AC

## 2023-04-22 NOTE — Telephone Encounter (Signed)
Pt called in to ask if pcp would send in a med for a yeast infection. Pt stated that the antibiotic caused a yeast infection. Pt would like for nurse to give a cb if this can be sent in please.   Cb#: [phone]

## 2023-05-02 ENCOUNTER — Encounter: Payer: Self-pay | Admitting: Family Medicine

## 2023-05-02 ENCOUNTER — Ambulatory Visit: Payer: BC Managed Care – PPO | Admitting: Family Medicine

## 2023-05-02 VITALS — BP 120/72 | HR 65 | Temp 97.5°F | Ht 63.0 in | Wt 155.6 lb

## 2023-05-02 DIAGNOSIS — I10 Essential (primary) hypertension: Secondary | ICD-10-CM

## 2023-05-02 DIAGNOSIS — J321 Chronic frontal sinusitis: Secondary | ICD-10-CM | POA: Diagnosis not present

## 2023-05-02 MED ORDER — LEVOFLOXACIN 500 MG PO TABS: 500.0000 mg | ORAL_TABLET | Freq: Every day | ORAL | 0 refills | Status: AC

## 2023-05-02 MED ORDER — FLUTICASONE PROPIONATE 50 MCG/ACT NA SUSP: 2.0000 | Freq: Every day | NASAL | 6 refills | Status: AC

## 2023-05-02 NOTE — Progress Notes (Signed)
Subjective:    Patient ID: Rebecca Herman, female    DOB: 1955-09-19, 67 y.o.   MRN: [id number]  HPI 04/07/23 4 days ago, the patient developed head congestion and cough.  She states that now her head is throbbing and pounding.  She reports severe pain in her upper teeth bilaterally.  She has tenderness to palpation and percussion Over her maxillary sinuses.  She reports postnasal drip as well as a barking cough that is nonproductive.  At that time, my plan was:  Patient appears to have a sinus infection.  She had a negative COVID test earlier this week.  Begin amoxicillin 875 mg twice daily for 10 days and use Hycodan as needed for cough.  05/02/23 Patient ultimately took Augmentin and prednisone November 1.  She states that nothing is helping.  She continues to feel extremely congested in her head.  She reports pain and pressure in her frontal and maxillary sinus areas.  She is having a difficult time breathing through her nose.  She reports postnasal drip causing her to cough frequently.  She reports a headache in the crown of her head.  This occurs on a daily basis.  She reports sinus pressure.  Nothing seems to be helping. Past Medical History:  Diagnosis Date   Osteoporosis    Varicose veins of bilateral lower extremities with pain    Past Surgical History:  Procedure Laterality Date   TONSILLECTOMY     TUBAL LIGATION     varicose veins      Current Outpatient Medications on File Prior to Visit  Medication Sig Dispense Refill   amoxicillin-clavulanate (AUGMENTIN) 875-125 MG tablet Take 1 tablet by mouth 2 (two) times daily. (Patient not taking: Reported on 05/02/2023) 20 tablet 0   Apple Cider Vinegar 300 MG TABS Take by mouth.     hydrochlorothiazide (MICROZIDE) 12.5 MG capsule Take 10 mg by mouth daily.     Multiple Vitamin (MULTIVITAMIN) tablet Take 1 tablet by mouth daily.     predniSONE (DELTASONE) 20 MG tablet 3 tabs poqday 1-2, 2 tabs poqday 3-4, 1 tab poqday 5-6 (Patient  not taking: Reported on 05/02/2023) 12 tablet 0   rosuvastatin (CRESTOR) 5 MG tablet Take 5 mg by mouth daily.     TURMERIC PO Take by mouth.     CINNAMON PO Take 1,000 mg by mouth daily. (Patient not taking: Reported on 03/09/2023)     ELDERBERRY PO Take by mouth. (Patient not taking: Reported on 03/09/2023)     HYDROcodone bit-homatropine (HYCODAN) 5-1.5 MG/5ML syrup Take 5 mLs by mouth every 8 (eight) hours as needed for cough. (Patient not taking: Reported on 05/02/2023) 120 mL 0   No current facility-administered medications on file prior to visit.   Marland Kitchenall Social History   Socioeconomic History   Marital status: Married    Spouse name: Not on file   Number of children: Not on file   Years of education: Not on file   Highest education level: Not on file  Occupational History   Not on file  Tobacco Use   Smoking status: Never   Smokeless tobacco: Never  Vaping Use   Vaping status: Never Used  Substance and Sexual Activity   Alcohol use: No   Drug use: No   Sexual activity: Not on file  Other Topics Concern   Not on file  Social History Narrative   Not on file   Social Determinants of Health   Financial Resource Strain: Not on file  Food Insecurity: Not on file  Transportation Needs: Not on file  Physical Activity: Not on file  Stress: Not on file  Social Connections: Not on file  Intimate Partner Violence: Not on file     Review of Systems  All other systems reviewed and are negative.      Objective:   Physical Exam Constitutional:      General: She is not in acute distress.    Appearance: Normal appearance. She is not ill-appearing or toxic-appearing.  HENT:     Right Ear: Tympanic membrane and ear canal normal.     Left Ear: Tympanic membrane and ear canal normal.     Nose: Congestion and rhinorrhea present.     Right Sinus: Maxillary sinus tenderness present. No frontal sinus tenderness.     Left Sinus: Maxillary sinus tenderness present. No frontal  sinus tenderness.     Mouth/Throat:     Pharynx: No oropharyngeal exudate or posterior oropharyngeal erythema.  Eyes:     Conjunctiva/sclera: Conjunctivae normal.  Cardiovascular:     Rate and Rhythm: Normal rate and regular rhythm.     Pulses: Normal pulses.     Heart sounds: Normal heart sounds.  Musculoskeletal:     Cervical back: Normal range of motion.  Lymphadenopathy:     Cervical: No cervical adenopathy.  Neurological:     Mental Status: She is alert.           Assessment & Plan:   Chronic frontal sinusitis - Plan: CBC with Differential/Platelet, COMPLETE METABOLIC PANEL WITH GFR  Benign essential HTN - Plan: CT CARDIAC SCORING (SELF PAY ONLY) We will try Levaquin 500 mg daily for 7 days coupled with Flonase 2 sprays each nostril daily coupled with nasal saline rinses multiple times a day.  If not improving over the next 10 days, would recommend CT scan at this and possibly an ENT consultation.  Meanwhile check CBC and CMP.  Patient would also like to schedule a coronary artery calcium score as her sister was recently diagnosed with coronary artery disease and she would like to risk stratify herself.

## 2023-05-03 LAB — COMPLETE METABOLIC PANEL WITH GFR
AG Ratio: 1.8 (calc) (ref 1.0–2.5)
ALT: 19 U/L (ref 6–29)
AST: 15 U/L (ref 10–35)
Albumin: 4.2 g/dL (ref 3.6–5.1)
Alkaline phosphatase (APISO): 80 U/L (ref 37–153)
BUN: 22 mg/dL (ref 7–25)
CO2: 32 mmol/L (ref 20–32)
Calcium: 9.1 mg/dL (ref 8.6–10.4)
Chloride: 102 mmol/L (ref 98–110)
Creat: 0.97 mg/dL (ref 0.50–1.05)
Globulin: 2.4 g/dL (ref 1.9–3.7)
Glucose, Bld: 84 mg/dL (ref 65–99)
Potassium: 4.1 mmol/L (ref 3.5–5.3)
Sodium: 140 mmol/L (ref 135–146)
Total Bilirubin: 0.6 mg/dL (ref 0.2–1.2)
Total Protein: 6.6 g/dL (ref 6.1–8.1)
eGFR: 65 mL/min/{1.73_m2} (ref >=60)

## 2023-05-03 LAB — CBC WITH DIFFERENTIAL/PLATELET
Absolute Lymphocytes: 1445 {cells}/uL (ref 850–3900)
Absolute Monocytes: 626 {cells}/uL (ref 200–950)
Basophils Absolute: 31 {cells}/uL (ref 0–200)
Basophils Relative: 0.5 %
Eosinophils Absolute: 229 {cells}/uL (ref 15–500)
Eosinophils Relative: 3.7 %
HCT: 42 % (ref 35.0–45.0)
Hemoglobin: 13.9 g/dL (ref 11.7–15.5)
MCH: 29.8 pg (ref 27.0–33.0)
MCHC: 33.1 g/dL (ref 32.0–36.0)
MCV: 90.1 fL (ref 80.0–100.0)
MPV: 11 fL (ref 7.5–12.5)
Monocytes Relative: 10.1 %
Neutro Abs: 3869 {cells}/uL (ref 1500–7800)
Neutrophils Relative %: 62.4 %
Platelets: 225 10*3/uL (ref 140–400)
RBC: 4.66 10*6/uL (ref 3.80–5.10)
RDW: 12.8 % (ref 11.0–15.0)
Total Lymphocyte: 23.3 %
WBC: 6.2 10*3/uL (ref 3.8–10.8)

## 2023-05-06 ENCOUNTER — Ambulatory Visit (HOSPITAL_COMMUNITY)
Admission: RE | Admit: 2023-05-06 | Discharge: 2023-05-06 | Disposition: A | Discharge status: Home / Self Care | Payer: BC Managed Care – PPO | Source: Ambulatory Visit | Attending: Family Medicine | Admitting: Family Medicine

## 2023-05-06 DIAGNOSIS — I10 Essential (primary) hypertension: Secondary | ICD-10-CM | POA: Insufficient documentation

## 2023-07-14 ENCOUNTER — Telehealth: Payer: Self-pay

## 2023-07-14 NOTE — Telephone Encounter (Signed)
Copied from CRM [id number]. Topic: Clinical - Medication Question >> Jul 14, 2023  9:22 AM Geroge Baseman wrote: Reason for CRM: Patient would like to know if getting the prolia injection would be beneficial to her after getting her bone density scan. She would like a call back from the office to discuss

## 2023-10-07 ENCOUNTER — Ambulatory Visit: Payer: Self-pay | Admitting: Family Medicine

## 2023-10-10 ENCOUNTER — Ambulatory Visit: Payer: Self-pay | Admitting: Family Medicine

## 2023-10-10 ENCOUNTER — Encounter: Payer: Self-pay | Admitting: Family Medicine

## 2023-10-10 VITALS — BP 120/64 | HR 75 | Temp 97.6°F | Ht 63.0 in | Wt 158.8 lb

## 2023-10-10 DIAGNOSIS — I10 Essential (primary) hypertension: Secondary | ICD-10-CM

## 2023-10-10 MED ORDER — LOSARTAN POTASSIUM 50 MG PO TABS: 50.0000 mg | ORAL_TABLET | Freq: Every day | ORAL | 3 refills | Status: AC

## 2023-10-10 MED ORDER — MELOXICAM 15 MG PO TABS: 15.0000 mg | ORAL_TABLET | Freq: Every day | ORAL | 3 refills | Status: AC

## 2023-10-10 NOTE — Progress Notes (Signed)
 Subjective:    Patient ID: Rebecca Herman, female    DOB: [birth year], 68 y.o.   MRN: [id number] Patient reports pain in the DIP joints on both hands.  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  She denies any pain in the MCP joints.  There is no erythema or warmth.  She does complain of cramping pain in the toes of her feet over the last several days.  She denies any fever or chills.  There is no swelling.  There is no erythema.  She has normal pulses in both feet.  There is no tenderness to palpation.  Past Medical History:  Diagnosis Date  . Osteoporosis   . Varicose veins of bilateral lower extremities with pain    Past Surgical History:  Procedure Laterality Date  . TONSILLECTOMY    . TUBAL LIGATION    . varicose veins      Current Outpatient Medications on File Prior to Visit  Medication Sig Dispense Refill  . Apple Cider Vinegar 300 MG TABS Take by mouth.    . denosumab (PROLIA) 60 MG/ML SOSY injection     . fluticasone  (FLONASE ) 50 MCG/ACT nasal spray Place 2 sprays into both nostrils daily. 16 g 6  . hydrochlorothiazide (MICROZIDE) 12.5 MG capsule Take 10 mg by mouth daily.    . Multiple Vitamin (MULTIVITAMIN) tablet Take 1 tablet by mouth daily.    . predniSONE  (DELTASONE ) 20 MG tablet 3 tabs poqday 1-2, 2 tabs poqday 3-4, 1 tab poqday 5-6 (Patient not taking: Reported on 10/10/2023) 12 tablet 0  . rosuvastatin (CRESTOR) 5 MG tablet Take 5 mg by mouth daily.    . TURMERIC PO Take by mouth.    Aaron Aas CINNAMON PO Take 1,000 mg by mouth daily. (Patient not taking: Reported on 10/10/2023)    . ELDERBERRY PO Take by mouth. (Patient not taking: Reported on 10/10/2023)    . HYDROcodone  bit-homatropine (HYCODAN) 5-1.5 MG/5ML syrup Take 5 mLs by mouth every 8 (eight) hours as needed for cough. (Patient not taking: Reported on 10/10/2023) 120 mL 0   No current facility-administered medications on file prior to visit.    Aaron Aasall Social History   Socioeconomic History  . Marital status: Married    Spouse name: Not on file  . Number of children: Not on file  . Years of education: Not on file  . Highest education level: Not on file  Occupational History  . Not on file  Tobacco Use  . Smoking status: Never  . Smokeless tobacco: Never  Vaping Use  . Vaping status: Never Used  Substance and Sexual Activity  . Alcohol use: No  . Drug use: No  . Sexual activity: Not on file  Other Topics Concern  . Not on file  Social History Narrative  . Not on file   Social Drivers of Health   Financial Resource Strain: Not on file  Food Insecurity: Not on file  Transportation Needs: Not on file  Physical Activity: Not on file  Stress: Not on file  Social Connections: Not on file  Intimate Partner Violence: Not on file     Review of Systems  Genitourinary:  Positive for dysuria.  All other systems reviewed and are negative.      Objective:   Physical Exam Constitutional:      General: She is not in acute distress.    Appearance: Normal appearance.  She is not ill-appearing, toxic-appearing or diaphoretic.  HENT:     Nose: No congestion or rhinorrhea.     Mouth/Throat:     Mouth: Mucous membranes are moist.     Pharynx: No oropharyngeal exudate or posterior oropharyngeal erythema.  Eyes:     Conjunctiva/sclera: Conjunctivae normal.  Cardiovascular:     Rate and Rhythm: Normal rate and regular rhythm.     Pulses: Normal pulses.     Heart sounds: Normal heart sounds.  Pulmonary:     Effort: No respiratory distress.     Breath sounds: Normal breath sounds. No stridor. No wheezing, rhonchi or rales.  Chest:     Chest wall: No tenderness.  Abdominal:     General: Bowel sounds are normal. There is no distension.     Palpations: Abdomen is soft.     Tenderness: There is no abdominal tenderness. There is no guarding or rebound.  Musculoskeletal:     Right hand: Deformity and tenderness present.  Decreased range of motion.     Left hand: Deformity and tenderness present. Decreased range of motion.  Neurological:     Mental Status: She is alert.          Assessment & Plan:   Benign essential HTN - Plan: Basic Metabolic Panel Without GFR, CBC with Differential/Platelet I believe the cramping may be due to hydrochlorothiazide and dehydration.  Discontinue hydrochlorothiazide and replace with losartan  50 mg daily for hypertension.  Monitor BMP to evaluate for any hypokalemia.  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  Recommended trying meloxicam  15 mg daily for pain.

## 2023-10-11 ENCOUNTER — Ambulatory Visit: Payer: Self-pay | Admitting: Family Medicine

## 2023-10-11 LAB — CBC WITH DIFFERENTIAL/PLATELET
Absolute Lymphocytes: 1619 {cells}/uL (ref 850–3900)
Absolute Monocytes: 570 {cells}/uL (ref 200–950)
Basophils Absolute: 51 {cells}/uL (ref 0–200)
Basophils Relative: 0.8 %
Eosinophils Absolute: 179 {cells}/uL (ref 15–500)
Eosinophils Relative: 2.8 %
HCT: 42.5 % (ref 35.0–45.0)
Hemoglobin: 13.8 g/dL (ref 11.7–15.5)
MCH: 28.6 pg (ref 27.0–33.0)
MCHC: 32.5 g/dL (ref 32.0–36.0)
MCV: 88.2 fL (ref 80.0–100.0)
MPV: 11 fL (ref 7.5–12.5)
Monocytes Relative: 8.9 %
Neutro Abs: 3981 {cells}/uL (ref 1500–7800)
Neutrophils Relative %: 62.2 %
Platelets: 226 10*3/uL (ref 140–400)
RBC: 4.82 10*6/uL (ref 3.80–5.10)
RDW: 12.5 % (ref 11.0–15.0)
Total Lymphocyte: 25.3 %
WBC: 6.4 10*3/uL (ref 3.8–10.8)

## 2023-10-11 LAB — BASIC METABOLIC PANEL WITHOUT GFR
BUN/Creatinine Ratio: 27 (calc) — ABNORMAL HIGH (ref 6–22)
BUN: 26 mg/dL — ABNORMAL HIGH (ref 7–25)
CO2: 31 mmol/L (ref 20–32)
Calcium: 9.6 mg/dL (ref 8.6–10.4)
Chloride: 98 mmol/L (ref 98–110)
Creat: 0.98 mg/dL (ref 0.50–1.05)
Glucose, Bld: 127 mg/dL — ABNORMAL HIGH (ref 65–99)
Potassium: 3.6 mmol/L (ref 3.5–5.3)
Sodium: 138 mmol/L (ref 135–146)

## 2023-11-01 ENCOUNTER — Ambulatory Visit: Admitting: Family Medicine

## 2023-11-01 ENCOUNTER — Ambulatory Visit: Payer: Self-pay

## 2023-11-01 ENCOUNTER — Encounter: Payer: Self-pay | Admitting: Family Medicine

## 2023-11-01 VITALS — BP 136/82 | HR 57 | Temp 97.7°F | Ht 63.0 in | Wt 161.1 lb

## 2023-11-01 DIAGNOSIS — W57XXXA Bitten or stung by nonvenomous insect and other nonvenomous arthropods, initial encounter: Secondary | ICD-10-CM

## 2023-11-01 DIAGNOSIS — R300 Dysuria: Secondary | ICD-10-CM

## 2023-11-01 LAB — URINALYSIS, ROUTINE W REFLEX MICROSCOPIC
Bacteria, UA: NONE SEEN /HPF
Bilirubin Urine: NEGATIVE
Glucose, UA: NEGATIVE
Hyaline Cast: NONE SEEN /LPF
Ketones, ur: NEGATIVE
Nitrite: NEGATIVE
Protein, ur: NEGATIVE
Specific Gravity, Urine: 1.006 (ref 1.001–1.035)
pH: 6.5 (ref 5.0–8.0)

## 2023-11-01 LAB — MICROSCOPIC MESSAGE

## 2023-11-01 MED ORDER — PHENAZOPYRIDINE HCL 100 MG PO TABS: 100.0000 mg | ORAL_TABLET | Freq: Three times a day (TID) | ORAL | 0 refills | Status: AC | PRN

## 2023-11-01 MED ORDER — DOXYCYCLINE HYCLATE 100 MG PO TABS: 100.0000 mg | ORAL_TABLET | Freq: Two times a day (BID) | ORAL | 0 refills | Status: AC

## 2023-11-01 NOTE — Progress Notes (Signed)
 Patient Office Visit  Assessment & Plan:   Dysuria -     Urinalysis, Routine w reflex microscopic -     Urine Culture; Future -     Phenazopyridine HCl; Take 1 tablet (100 mg total) by mouth 3 (three) times daily as needed for up to 7 days for pain.  Dispense: 21 tablet; Refill: 0 -     Microscopic Message  Tick bite, unspecified site, initial encounter -     Doxycycline Hyclate; Take 1 tablet (100 mg total) by mouth 2 (two) times daily.  Dispense: 20 tablet; Refill: 0   Assessment and Plan    Urinary tract infection Recurrent urinary tract infections with dysuria, fever, and chills. Differential includes UTI versus systemic infection from tick bite. Urinalysis pending. - Order urinalysis to confirm UTI. - Consider Pyridium for dysuria relief.  Tick bite Tick bite on right thigh with pruritus and red spot. No erythema migrans or systemic symptoms of Lyme disease. Fever and chills possibly related to tick bite. - Prescribe doxycycline for 10 days. - Consider hydrocortisone cream for pruritus.           Return if symptoms worsen or fail to improve.   Subjective:     Patient ID: Rebecca Herman, female    DOB: [birth year]  Age: 68 y.o. MRN: [id number]  Chief Complaint  Patient presents with   Insect Bite    Tick bite 2 days ago on backside of R leg.    Dysuria    X 2 days    Dysuria    Discussed the use of AI scribe software for clinical note transcription with the patient, who gave verbal consent to proceed.  History of Present Illness   Rebecca Herman is a 68 year old female who presents with burning during urination and tick bites.  She has been experiencing burning during urination for the past two days, accompanied by fever and chills. She has a history of urinary tract infections, sometimes with hematuria, but no blood in the urine this time. She acknowledges not drinking enough water and feels cold at work due to the new building's temperature, which may  contribute to her discomfort. She has taken Aleve without significant relief. She feels 'yucky' with back pain, but has no headaches, sensitivity to light, or neck stiffness. She has not used Pyridium or over-the-counter Azo for urinary symptoms.  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 She noticed the tick on Sunday and describes the bite as being on the back of her thigh. The area is itchy and has a small white head that has rubbed off due to wearing pants. She typically gets three tick bites a year and usually receives antibiotics for them. She denies removing the tick herself, stating it came off on its own. She has not noticed any bullseye rash or streaking around the bite.  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 She mentions feeling exhausted, having taken a nap in her car during lunch, and going to bed early due to feeling unwell. She has dogs that are treated with Bravecto to prevent tick infestations.      Physical Exam SKIN: 2 Red spots on right posterior thigh, no bullseye Results Assessment & Plan Urinary tract infection Recurrent urinary tract infections with dysuria, fever, and chills. Differential includes UTI versus systemic infection from tick bite. Urinalysis pending. -  Order urinalysis to confirm UTI. - Consider Pyridium for dysuria relief.  Tick bite Tick bites on right thigh with pruritus and red spots. No erythema migrans or systemic symptoms of Lyme disease. Fever and chills possibly related to tick bite. - Prescribe doxycycline for 10 days. - Consider hydrocortisone cream for pruritus.    The 10-year ASCVD risk score (Arnett DK, et al., 2019) is: 6.6%  Past Medical History:  Diagnosis Date   Osteoporosis    Varicose veins of bilateral lower extremities with pain    Past Surgical History:  Procedure Laterality Date   TONSILLECTOMY     TUBAL  LIGATION     varicose veins     Social History   Tobacco Use   Smoking status: Never   Smokeless tobacco: Never  Vaping Use   Vaping status: Never Used  Substance Use Topics   Alcohol use: No   Drug use: No   Family History  Problem Relation Age of Onset   Cancer Mother        breast   Diabetes Mother    Hypertension Mother    Cancer Father    Breast cancer Sister    Ovarian cancer Paternal Grandmother    No Known Allergies  Review of Systems  Genitourinary:  Positive for dysuria.      Objective:    BP 136/82   Pulse (!) 57   Temp 97.7 F (36.5 C)   Ht 5\' 3"  (1.6 m)   Wt 161 lb 2 oz (73.1 kg)   SpO2 99%   BMI 28.54 kg/m  BP Readings from Last 3 Encounters:  11/01/23 136/82  10/10/23 120/64  05/02/23 120/72   Wt Readings from Last 3 Encounters:  11/01/23 161 lb 2 oz (73.1 kg)  10/10/23 158 lb 12.8 oz (72 kg)  05/02/23 155 lb 9.6 oz (70.6 kg)    Physical Exam Vitals and nursing note reviewed.  Constitutional:      Appearance: Normal appearance.  HENT:     Head: Normocephalic.     Right Ear: Tympanic membrane and ear canal normal.     Left Ear: Tympanic membrane and ear canal normal.  Eyes:     Extraocular Movements: Extraocular movements intact.     Pupils: Pupils are equal, round, and reactive to light.     Comments: Not photophobic  Cardiovascular:     Rate and Rhythm: Normal rate and regular rhythm.     Heart sounds: Normal heart sounds.  Pulmonary:     Effort: Pulmonary effort is normal.     Breath sounds: Normal breath sounds.  Musculoskeletal:     Right lower leg: No edema.     Left lower leg: No edema.  Skin:    Findings: Rash present.     Comments: Right posterior thigh- 2 tick bites, indurated.  Neurological:     General: No focal deficit present.     Mental Status: She is alert and oriented to person, place, and time.      Results for orders placed or performed in visit on 11/01/23  Urinalysis, Routine w reflex microscopic   Result Value Ref Range   Color, Urine LIGHT YELLOW YELLOW   APPearance CLEAR CLEAR   Specific Gravity, Urine 1.006 1.001 - 1.035   pH 6.5 5.0 - 8.0   Glucose, UA NEGATIVE NEGATIVE   Bilirubin Urine NEGATIVE NEGATIVE   Ketones, ur NEGATIVE NEGATIVE   Hgb urine dipstick TRACE (A) NEGATIVE   Protein, ur NEGATIVE NEGATIVE  Nitrite NEGATIVE NEGATIVE   Leukocytes,Ua TRACE (A) NEGATIVE   WBC, UA 0-5 0 - 5 /HPF   RBC / HPF 0-2 0 - 2 /HPF   Squamous Epithelial / HPF 0-5 < OR = 5 /HPF   Bacteria, UA NONE SEEN NONE SEEN /HPF   Hyaline Cast NONE SEEN NONE SEEN /LPF  Microscopic Message  Result Value Ref Range   Note

## 2023-11-01 NOTE — Telephone Encounter (Signed)
 Copied from CRM [id number]. Topic: Clinical - Red Word Triage >> Nov 01, 2023  7:52 AM Carlatta H wrote: [facility] that prompted transfer to Nurse Triage: Patient got bit by 2 ticks and she feels really bad and is having flu like symptoms//   Chief Complaint: Tick Bite Symptoms: itching, generalized malaise, cold chills Frequency: 2 days ago--Started feeling bad one day ago Pertinent Negatives: Patient denies nausea, vomiting, diarrhea, chest pain, difficulty breathing, abdominal pain, rash,  Disposition: [] ED /[] Urgent Care (no appt availability in office) / [x] Appointment(In office/virtual)/ []  [facility]/ [] Home Care/ [] Refused Recommended Disposition /[] [facility]/ []  Follow-up with PCP Additional Notes: Patient called and advised that she has been feeling bad for the past day with generalized malaise, cold chills, and feeling like she has the flu but she states that she found two spots on the back of her right thigh after being outside this weekend and she suspects tick bites. She is having itching at the site. Patient did not see the tick but states that this is how she feels every time she has had a tick bite and the spots on the back of her right thigh look like previous tick bites. Patient denies any chest pain, nausea, vomiting, diarrhea, difficulty breathing, abdominal pain, bulls eye rash, or any other rash around the area. Appointment is made for today 11/01/2023 at 11am with Dr Amadeo June. Patient is also advised that if she gets worse to go to the Emergency Room.  Reason for Disposition  [1] 2 to 14 days following tick bite AND [2] widespread rash or headache AND [3] no fever  Answer Assessment - Initial Assessment Questions 1. ATTACHED:  "Is the tick still on the skin?"  (e.g., yes, no, unsure)     No 2. ONSET - TICK STILL ATTACHED:  "How long do you think the tick has been on your skin?" (e.g., hours, days, unsure)  Note:  Is there a recent activity  (camping, hiking) where the caller may have been exposed?     No 3. ONSET - TICK NOT STILL ATTACHED: "If the tick has been removed, how long do you think the tick was attached before you removed it?" (e.g., 5 hours, 2 days). "When was this?"     No 4. LOCATION: "Where is the tick bite located?" (e.g., arm, leg)     Tick not present --- two spots that look like tick bites on the back of right thigh 5. TYPE of TICK: "Is it a wood tick or a deer tick?" (e.g., deer tick, wood tick; unsure)     Unknown 6. SIZE of TICK: "How big is the tick?" (e.g., size of poppy seed, apple seed, watermelon seed; unsure) Note: Deer ticks can be the size of a poppy seed (nymph) or an apple seed (adult).       N/A 7. ENGORGED: "Did the tick look flat or engorged (full, swollen)?" (e.g., flat, engorged; unsure)     N/A 8. OTHER SYMPTOMS: "Do you have any other symptoms?" (e.g., fever, rash, redness at bite area, red ring around bite)     Pt states cold chills  Protocols used: Tick Bite-A-AH

## 2023-11-02 LAB — URINE CULTURE
MICRO NUMBER:: 16448685
SPECIMEN QUALITY:: ADEQUATE

## 2023-11-03 ENCOUNTER — Ambulatory Visit: Payer: Self-pay | Admitting: Family Medicine

## 2024-01-23 ENCOUNTER — Other Ambulatory Visit: Payer: Self-pay | Admitting: Family Medicine

## 2024-07-10 ENCOUNTER — Encounter: Payer: Self-pay | Admitting: Family Medicine

## 2024-07-10 ENCOUNTER — Ambulatory Visit: Admitting: Family Medicine

## 2024-07-10 VITALS — BP 146/76 | HR 56 | Temp 97.9°F | Ht 63.0 in | Wt 163.0 lb

## 2024-07-10 DIAGNOSIS — M25561 Pain in right knee: Secondary | ICD-10-CM | POA: Diagnosis not present

## 2024-07-10 DIAGNOSIS — M7521 Bicipital tendinitis, right shoulder: Secondary | ICD-10-CM | POA: Diagnosis not present

## 2024-07-10 MED ORDER — MELOXICAM 15 MG PO TABS: 15.0000 mg | ORAL_TABLET | Freq: Every day | ORAL | 5 refills | Status: AC

## 2024-07-10 NOTE — Progress Notes (Signed)
 "  Subjective:    Patient ID: Rebecca Herman, female    DOB: 06/18/1956, 69 y.o.   MRN: [id number]  Patient reports pain over the lateral compartment of her right knee.  Pain has been there for several months.  Has been gradually getting worse.  She now has an effusion over the lateral compartment of her right knee.  She reports clicking and grinding with activity.  She reports pain going up and down stairs and with walking.  She has been taking ibuprofen intermittently  She also complains of pain in her right shoulder.  She has no pain with abduction greater than 90 degrees.  She has full and painless active and passive range of motion in the shoulder.  She has a negative empty can sign.  She has a negative Hawking sign.  She does have tenderness in the biceps tendon near its insertion at the glenohumeral joint with resisted elbow flexion Past Medical History:  Diagnosis Date   Osteoporosis    Varicose veins of bilateral lower extremities with pain    Past Surgical History:  Procedure Laterality Date   TONSILLECTOMY     TUBAL LIGATION     varicose veins      Current Outpatient Medications on File Prior to Visit  Medication Sig Dispense Refill   Apple Cider Vinegar 300 MG TABS Take by mouth.     denosumab (PROLIA) 60 MG/ML SOSY injection      doxycycline  (VIBRA -TABS) 100 MG tablet Take 1 tablet (100 mg total) by mouth 2 (two) times daily. 20 tablet 0   fluticasone  (FLONASE ) 50 MCG/ACT nasal spray Place 2 sprays into both nostrils daily. 16 g 6   hydrochlorothiazide (MICROZIDE) 12.5 MG capsule Take 10 mg by mouth daily.     losartan  (COZAAR ) 50 MG tablet Take 1 tablet (50 mg total) by mouth daily. 90 tablet 3   meloxicam  (MOBIC ) 15 MG tablet Take 1 tablet (15 mg total) by mouth daily. 30 tablet 3   Multiple Vitamin (MULTIVITAMIN) tablet Take 1 tablet by mouth daily.     rosuvastatin (CRESTOR) 5 MG tablet Take 5 mg by mouth daily.     TURMERIC PO Take by mouth.     predniSONE  (DELTASONE ) 20  MG tablet 3 tabs poqday 1-2, 2 tabs poqday 3-4, 1 tab poqday 5-6 (Patient not taking: Reported on 07/10/2024) 12 tablet 0   No current facility-administered medications on file prior to visit.   SABRAall Social History   Socioeconomic History   Marital status: Married    Spouse name: Not on file   Number of children: Not on file   Years of education: Not on file   Highest education level: Not on file  Occupational History   Not on file  Tobacco Use   Smoking status: Never   Smokeless tobacco: Never  Vaping Use   Vaping status: Never Used  Substance and Sexual Activity   Alcohol use: No   Drug use: No   Sexual activity: Not on file  Other Topics Concern   Not on file  Social History Narrative   Not on file   Social Drivers of Health   Tobacco Use: Low Risk (07/10/2024)   Patient History    Smoking Tobacco Use: Never    Smokeless Tobacco Use: Never    Passive Exposure: Not on file  Financial Resource Strain: Not on file  Food Insecurity: Not on file  Transportation Needs: Not on file  Physical Activity: Not on file  Stress:  Not on file  Social Connections: Not on file  Intimate Partner Violence: Not on file  Depression [phone]): Low Risk (07/10/2024)   Depression (PHQ2-9)    PHQ-2 Score: 0  Alcohol Screen: Not on file  Housing: Not on file  Utilities: Not on file  Health Literacy: Not on file     Review of Systems  Genitourinary:  Positive for dysuria.  All other systems reviewed and are negative.      Objective:   Physical Exam Constitutional:      General: She is not in acute distress.    Appearance: Normal appearance. She is not ill-appearing, toxic-appearing or diaphoretic.  HENT:     Nose: No congestion or rhinorrhea.     Mouth/Throat:     Mouth: Mucous membranes are moist.     Pharynx: No oropharyngeal exudate or posterior oropharyngeal erythema.  Eyes:     Conjunctiva/sclera: Conjunctivae normal.  Cardiovascular:     Rate and Rhythm: Normal rate  and regular rhythm.     Pulses: Normal pulses.     Heart sounds: Normal heart sounds.  Pulmonary:     Effort: No respiratory distress.     Breath sounds: Normal breath sounds. No stridor. No wheezing, rhonchi or rales.  Chest:     Chest wall: No tenderness.  Abdominal:     General: Bowel sounds are normal. There is no distension.     Palpations: Abdomen is soft.     Tenderness: There is no abdominal tenderness. There is no guarding or rebound.  Musculoskeletal:     Right shoulder: Tenderness present. No swelling, deformity, bony tenderness or crepitus. Normal range of motion. Normal strength.     Right knee: Effusion and bony tenderness present. Decreased range of motion. Tenderness present over the lateral joint line.  Neurological:     Mental Status: She is alert.           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  Obtain x-ray to evaluate further.  Begin meloxicam  15 mg daily.  If not improving she can return for cortisone injection.  I believe the shoulder pain is due to biceps tendinitis.  Recommended trying meloxicam  and also using physical therapy.  If not improving she could see orthopedics for a cortisone injection near the biceps tendon "

## 2024-07-12 ENCOUNTER — Ambulatory Visit
Admission: RE | Admit: 2024-07-12 | Discharge: 2024-07-12 | Disposition: A | Discharge status: Home / Self Care | Source: Ambulatory Visit | Attending: Family Medicine | Admitting: Family Medicine

## 2024-07-12 DIAGNOSIS — M25561 Pain in right knee: Secondary | ICD-10-CM

## 2024-07-17 ENCOUNTER — Ambulatory Visit: Payer: Self-pay | Admitting: Family Medicine
